# Patient Record
Sex: Female | Born: 1945 | Race: White | Hispanic: No | Smoking: Current every day smoker
Health system: Southern US, Community
[De-identification: ages and names within clinical notes are randomized; demographics above are authoritative.]

## PROBLEM LIST (undated history)

## (undated) DIAGNOSIS — F32A Depression, unspecified: Secondary | ICD-10-CM

## (undated) DIAGNOSIS — M810 Age-related osteoporosis without current pathological fracture: Secondary | ICD-10-CM

## (undated) DIAGNOSIS — F329 Major depressive disorder, single episode, unspecified: Secondary | ICD-10-CM

## (undated) DIAGNOSIS — J449 Chronic obstructive pulmonary disease, unspecified: Secondary | ICD-10-CM

## (undated) DIAGNOSIS — K219 Gastro-esophageal reflux disease without esophagitis: Secondary | ICD-10-CM

## (undated) DIAGNOSIS — E785 Hyperlipidemia, unspecified: Secondary | ICD-10-CM

## (undated) DIAGNOSIS — I10 Essential (primary) hypertension: Secondary | ICD-10-CM

## (undated) DIAGNOSIS — G4730 Sleep apnea, unspecified: Secondary | ICD-10-CM

## (undated) HISTORY — PX: KNEE ARTHROSCOPY: SHX127

## (undated) HISTORY — PX: ABDOMINAL HYSTERECTOMY: SHX81

## (undated) HISTORY — PX: BREAST SURGERY: SHX581

## (undated) HISTORY — PX: COLONOSCOPY: SHX174

## (undated) HISTORY — PX: TONSILLECTOMY: SUR1361

---

## 2011-10-14 ENCOUNTER — Other Ambulatory Visit: Payer: Self-pay | Admitting: Orthopedic Surgery

## 2011-10-24 ENCOUNTER — Encounter (HOSPITAL_BASED_OUTPATIENT_CLINIC_OR_DEPARTMENT_OTHER): Payer: Self-pay | Admitting: *Deleted

## 2011-10-24 NOTE — Progress Notes (Signed)
[place] lives in [place] comong in 2 hr preop for ekg-istat Will see if she has had any with pcp

## 2011-10-26 ENCOUNTER — Encounter (HOSPITAL_BASED_OUTPATIENT_CLINIC_OR_DEPARTMENT_OTHER): Payer: Self-pay | Admitting: Anesthesiology

## 2011-10-26 ENCOUNTER — Ambulatory Visit (HOSPITAL_BASED_OUTPATIENT_CLINIC_OR_DEPARTMENT_OTHER): Payer: BC Managed Care – PPO | Admitting: Anesthesiology

## 2011-10-26 ENCOUNTER — Ambulatory Visit (HOSPITAL_BASED_OUTPATIENT_CLINIC_OR_DEPARTMENT_OTHER)
Admission: RE | Admit: 2011-10-26 | Discharge: 2011-10-26 | Disposition: A | Discharge status: Home / Self Care | Payer: BC Managed Care – PPO | Source: Ambulatory Visit | Attending: Orthopedic Surgery | Admitting: Orthopedic Surgery

## 2011-10-26 ENCOUNTER — Encounter (HOSPITAL_BASED_OUTPATIENT_CLINIC_OR_DEPARTMENT_OTHER): Payer: Self-pay | Admitting: Orthopedic Surgery

## 2011-10-26 ENCOUNTER — Encounter (HOSPITAL_BASED_OUTPATIENT_CLINIC_OR_DEPARTMENT_OTHER)
Admission: RE | Disposition: A | Discharge status: Home / Self Care | Payer: Self-pay | Source: Ambulatory Visit | Attending: Orthopedic Surgery

## 2011-10-26 DIAGNOSIS — M9380 Other specified osteochondropathies of unspecified site: Secondary | ICD-10-CM | POA: Insufficient documentation

## 2011-10-26 DIAGNOSIS — G5600 Carpal tunnel syndrome, unspecified upper limb: Secondary | ICD-10-CM | POA: Insufficient documentation

## 2011-10-26 DIAGNOSIS — M810 Age-related osteoporosis without current pathological fracture: Secondary | ICD-10-CM | POA: Insufficient documentation

## 2011-10-26 DIAGNOSIS — J449 Chronic obstructive pulmonary disease, unspecified: Secondary | ICD-10-CM | POA: Insufficient documentation

## 2011-10-26 DIAGNOSIS — F17200 Nicotine dependence, unspecified, uncomplicated: Secondary | ICD-10-CM | POA: Insufficient documentation

## 2011-10-26 DIAGNOSIS — G4730 Sleep apnea, unspecified: Secondary | ICD-10-CM | POA: Insufficient documentation

## 2011-10-26 DIAGNOSIS — I10 Essential (primary) hypertension: Secondary | ICD-10-CM | POA: Insufficient documentation

## 2011-10-26 DIAGNOSIS — F3289 Other specified depressive episodes: Secondary | ICD-10-CM | POA: Insufficient documentation

## 2011-10-26 DIAGNOSIS — F329 Major depressive disorder, single episode, unspecified: Secondary | ICD-10-CM | POA: Insufficient documentation

## 2011-10-26 DIAGNOSIS — J4489 Other specified chronic obstructive pulmonary disease: Secondary | ICD-10-CM | POA: Insufficient documentation

## 2011-10-26 DIAGNOSIS — M65839 Other synovitis and tenosynovitis, unspecified forearm: Secondary | ICD-10-CM | POA: Insufficient documentation

## 2011-10-26 DIAGNOSIS — K219 Gastro-esophageal reflux disease without esophagitis: Secondary | ICD-10-CM | POA: Insufficient documentation

## 2011-10-26 DIAGNOSIS — E785 Hyperlipidemia, unspecified: Secondary | ICD-10-CM | POA: Insufficient documentation

## 2011-10-26 HISTORY — DX: Depression, unspecified: F32.A

## 2011-10-26 HISTORY — DX: Age-related osteoporosis without current pathological fracture: M81.0

## 2011-10-26 HISTORY — PX: CARPECTOMY: SHX5004

## 2011-10-26 HISTORY — PX: CARPAL TUNNEL RELEASE: SHX101

## 2011-10-26 HISTORY — DX: Gastro-esophageal reflux disease without esophagitis: K21.9

## 2011-10-26 HISTORY — DX: Hyperlipidemia, unspecified: E78.5

## 2011-10-26 HISTORY — DX: Essential (primary) hypertension: I10

## 2011-10-26 HISTORY — DX: Major depressive disorder, single episode, unspecified: F32.9

## 2011-10-26 HISTORY — DX: Chronic obstructive pulmonary disease, unspecified: J44.9

## 2011-10-26 HISTORY — DX: Sleep apnea, unspecified: G47.30

## 2011-10-26 LAB — POCT I-STAT, CHEM 8
Calcium, Ion: 1.26 mmol/L (ref 1.13–1.30)
Creatinine, Ser: 0.8 mg/dL (ref 0.50–1.10)
Glucose, Bld: 106 mg/dL — ABNORMAL HIGH (ref 70–99)
HCT: 42 % (ref 36.0–46.0)
Hemoglobin: 14.3 g/dL (ref 12.0–15.0)
Potassium: 5 mEq/L (ref 3.5–5.1)
TCO2: 26 mmol/L (ref 0–100)

## 2011-10-26 SURGERY — CARPECTOMY
Anesthesia: General | Site: Wrist | Laterality: Left | Wound class: Clean

## 2011-10-26 MED ORDER — HYDROMORPHONE HCL PF 1 MG/ML IJ SOLN: 0.2500 mg | INTRAMUSCULAR | Status: DC | PRN

## 2011-10-26 MED ORDER — METOCLOPRAMIDE HCL 5 MG/ML IJ SOLN: 10.0000 mg | Freq: Once | INTRAMUSCULAR | Status: DC | PRN

## 2011-10-26 MED ORDER — DEXTROSE 5 % IV SOLN
3.0000 g | INTRAVENOUS | Status: AC
  Administered 2011-10-26: 3 g via INTRAVENOUS

## 2011-10-26 MED ORDER — DEXAMETHASONE SODIUM PHOSPHATE 10 MG/ML IJ SOLN
INTRAMUSCULAR | Status: DC | PRN
  Administered 2011-10-26: 10 mg via INTRAVENOUS

## 2011-10-26 MED ORDER — OXYCODONE HCL 5 MG/5ML PO SOLN: 5.0000 mg | Freq: Once | ORAL | Status: DC | PRN

## 2011-10-26 MED ORDER — FENTANYL CITRATE 0.05 MG/ML IJ SOLN
50.0000 ug | INTRAMUSCULAR | Status: DC | PRN
  Administered 2011-10-26: 50 ug via INTRAVENOUS

## 2011-10-26 MED ORDER — LACTATED RINGERS IV SOLN
INTRAVENOUS | Status: DC
  Administered 2011-10-26 (×2): via INTRAVENOUS

## 2011-10-26 MED ORDER — EPHEDRINE SULFATE 50 MG/ML IJ SOLN
INTRAMUSCULAR | Status: DC | PRN
  Administered 2011-10-26 (×3): 10 mg via INTRAVENOUS

## 2011-10-26 MED ORDER — PHENYLEPHRINE HCL 10 MG/ML IJ SOLN
INTRAMUSCULAR | Status: DC | PRN
  Administered 2011-10-26 (×2): 80 ug via INTRAVENOUS

## 2011-10-26 MED ORDER — LIDOCAINE HCL (CARDIAC) 20 MG/ML IV SOLN
INTRAVENOUS | Status: DC | PRN
  Administered 2011-10-26: 40 mg via INTRAVENOUS

## 2011-10-26 MED ORDER — ROPIVACAINE HCL 5 MG/ML IJ SOLN
INTRAMUSCULAR | Status: DC | PRN
  Administered 2011-10-26: 25 mL

## 2011-10-26 MED ORDER — CHLORHEXIDINE GLUCONATE 4 % EX LIQD: 60.0000 mL | Freq: Once | CUTANEOUS | Status: DC

## 2011-10-26 MED ORDER — PROPOFOL 10 MG/ML IV EMUL
INTRAVENOUS | Status: DC | PRN
  Administered 2011-10-26: 200 mg via INTRAVENOUS

## 2011-10-26 MED ORDER — LIDOCAINE HCL 1 % IJ SOLN
INTRAMUSCULAR | Status: DC | PRN
  Administered 2011-10-26: 2 mL via INTRADERMAL

## 2011-10-26 MED ORDER — ONDANSETRON HCL 4 MG/2ML IJ SOLN
INTRAMUSCULAR | Status: DC | PRN
  Administered 2011-10-26: 4 mg via INTRAVENOUS

## 2011-10-26 MED ORDER — OXYCODONE HCL 5 MG PO TABS: 5.0000 mg | ORAL_TABLET | Freq: Once | ORAL | Status: DC | PRN

## 2011-10-26 MED ORDER — OXYCODONE-ACETAMINOPHEN 7.5-500 MG PO TABS: 1.0000 | ORAL_TABLET | ORAL | Status: AC | PRN

## 2011-10-26 MED ORDER — MIDAZOLAM HCL 2 MG/2ML IJ SOLN
1.0000 mg | INTRAMUSCULAR | Status: DC | PRN
  Administered 2011-10-26: 2 mg via INTRAVENOUS

## 2011-10-26 SURGICAL SUPPLY — 58 items
BANDAGE GAUZE ELAST BULKY 4 IN (GAUZE/BANDAGES/DRESSINGS) ×2 IMPLANT
BLADE ARTHRO LOK 4 BEAVER (BLADE) ×2 IMPLANT
BLADE EAR TYMPAN 2.5 60D BEAV (BLADE) IMPLANT
BLADE MINI RND TIP GREEN BEAV (BLADE) ×2 IMPLANT
BLADE SURG 15 STRL LF DISP TIS (BLADE) ×1 IMPLANT
BLADE SURG 15 STRL SS (BLADE) ×1
BNDG COHESIVE 3X5 TAN STRL LF (GAUZE/BANDAGES/DRESSINGS) ×2 IMPLANT
BNDG ESMARK 4X9 LF (GAUZE/BANDAGES/DRESSINGS) ×2 IMPLANT
CHLORAPREP W/TINT 26ML (MISCELLANEOUS) ×2 IMPLANT
CLOTH BEACON ORANGE TIMEOUT ST (SAFETY) ×2 IMPLANT
CORDS BIPOLAR (ELECTRODE) ×4 IMPLANT
COVER MAYO STAND STRL (DRAPES) ×2 IMPLANT
COVER TABLE BACK 60X90 (DRAPES) ×2 IMPLANT
CUFF TOURNIQUET SINGLE 18IN (TOURNIQUET CUFF) IMPLANT
CUFF TOURNIQUET SINGLE 24IN (TOURNIQUET CUFF) ×2 IMPLANT
DECANTER SPIKE VIAL GLASS SM (MISCELLANEOUS) IMPLANT
DRAPE EXTREMITY T 121X128X90 (DRAPE) ×2 IMPLANT
DRAPE OEC MINIVIEW 54X84 (DRAPES) ×2 IMPLANT
DRAPE SURG 17X23 STRL (DRAPES) ×2 IMPLANT
DRSG KUZMA FLUFF (GAUZE/BANDAGES/DRESSINGS) ×2 IMPLANT
GAUZE XEROFORM 1X8 LF (GAUZE/BANDAGES/DRESSINGS) ×2 IMPLANT
GLOVE BIO SURGEON STRL SZ 6.5 (GLOVE) ×2 IMPLANT
GLOVE BIOGEL PI IND STRL 6.5 (GLOVE) ×1 IMPLANT
GLOVE BIOGEL PI IND STRL 8.5 (GLOVE) ×1 IMPLANT
GLOVE BIOGEL PI INDICATOR 6.5 (GLOVE) ×1
GLOVE BIOGEL PI INDICATOR 8.5 (GLOVE) ×1
GLOVE ECLIPSE 6.5 STRL STRAW (GLOVE) ×6 IMPLANT
GLOVE SURG ORTHO 8.0 STRL STRW (GLOVE) ×2 IMPLANT
GLOVE SURG SS PI 8.5 STRL IVOR (GLOVE) ×1
GLOVE SURG SS PI 8.5 STRL STRW (GLOVE) ×1 IMPLANT
GOWN BRE IMP PREV XXLGXLNG (GOWN DISPOSABLE) ×2 IMPLANT
GOWN PREVENTION PLUS XLARGE (GOWN DISPOSABLE) ×6 IMPLANT
NEEDLE 27GAX1X1/2 (NEEDLE) ×2 IMPLANT
NS IRRIG 1000ML POUR BTL (IV SOLUTION) ×2 IMPLANT
PACK BASIN DAY SURGERY FS (CUSTOM PROCEDURE TRAY) ×2 IMPLANT
PAD CAST 3X4 CTTN HI CHSV (CAST SUPPLIES) ×1 IMPLANT
PADDING CAST ABS 3INX4YD NS (CAST SUPPLIES) ×1
PADDING CAST ABS 4INX4YD NS (CAST SUPPLIES) ×1
PADDING CAST ABS COTTON 3X4 (CAST SUPPLIES) ×1 IMPLANT
PADDING CAST ABS COTTON 4X4 ST (CAST SUPPLIES) ×1 IMPLANT
PADDING CAST COTTON 3X4 STRL (CAST SUPPLIES) ×1
SLEEVE SCD COMPRESS KNEE MED (MISCELLANEOUS) ×2 IMPLANT
SPLINT PLASTER CAST XFAST 3X15 (CAST SUPPLIES) ×11 IMPLANT
SPLINT PLASTER XTRA FASTSET 3X (CAST SUPPLIES) ×11
SPONGE GAUZE 4X4 12PLY (GAUZE/BANDAGES/DRESSINGS) ×2 IMPLANT
STOCKINETTE 4X48 STRL (DRAPES) ×2 IMPLANT
SUT MERSILENE 3 0 FS 1 (SUTURE) ×2 IMPLANT
SUT VIC AB 0 CT1 27 (SUTURE)
SUT VIC AB 0 CT1 27XBRD ANBCTR (SUTURE) IMPLANT
SUT VIC AB 2-0 SH 27 (SUTURE)
SUT VIC AB 2-0 SH 27XBRD (SUTURE) IMPLANT
SUT VICRYL 4-0 PS2 18IN ABS (SUTURE) ×2 IMPLANT
SUT VICRYL RAPIDE 4/0 PS 2 (SUTURE) ×2 IMPLANT
SYR BULB 3OZ (MISCELLANEOUS) ×2 IMPLANT
SYR CONTROL 10ML LL (SYRINGE) IMPLANT
TOWEL OR 17X24 6PK STRL BLUE (TOWEL DISPOSABLE) ×2 IMPLANT
UNDERPAD 30X30 INCONTINENT (UNDERPADS AND DIAPERS) ×2 IMPLANT
WATER STERILE IRR 1000ML POUR (IV SOLUTION) ×2 IMPLANT

## 2011-10-26 NOTE — Transfer of Care (Signed)
Immediate Anesthesia Transfer of Care Note  Patient: Rebecca [name]  Procedure(s) Performed: Procedure(s) (LRB): CARPECTOMY (Left) CARPAL TUNNEL RELEASE (Left)  Patient Location: PACU  Anesthesia Type: GA combined with regional for post-op pain  Level of Consciousness: sedated  Airway & Oxygen Therapy: Patient Spontanous Breathing and Patient connected to face mask oxygen  Post-op Assessment: Report given to PACU RN and Post -op Vital signs reviewed and stable  Post vital signs: Reviewed and stable  Complications: No apparent anesthesia complications

## 2011-10-26 NOTE — Op Note (Signed)
Dictated number: [id number]

## 2011-10-26 NOTE — Progress Notes (Signed)
Assisted Dr. Frederick with left, ultrasound guided, supraclavicular block. Side rails up, monitors on throughout procedure. See vital signs in flow sheet. Tolerated Procedure well. 
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
## 2011-10-26 NOTE — Anesthesia Procedure Notes (Addendum)
Anesthesia Regional Block:  Supraclavicular block  Pre-Anesthetic Checklist: ,, timeout performed, Correct Patient, Correct Site, Correct Laterality, Correct Procedure, Correct Position, site marked, Risks and benefits discussed,  Surgical consent,  Pre-op evaluation,  At surgeon's request and post-op pain management  Laterality: Left  Prep: chloraprep       Needles:   Needle Type: Other   (Arrow Echogenic)   Needle Length: 9cm  Needle Gauge: 21    Additional Needles:  Procedures: ultrasound guided Supraclavicular block Narrative:  Start time: 10/26/2011 10:36 AM End time: 10/26/2011 10:42 AM Injection made incrementally with aspirations every 5 mL.  Performed by: Personally  Anesthesiologist: C Frederick  Additional Notes: Ultrasound guidance used to: id relevant anatomy, confirm needle position, local anesthetic spread, avoidance of vascular puncture. Picture saved. No complications. Block performed personally by Janetta Hora. Gelene Mink, MD    Supraclavicular block Procedure Name: LMA Insertion Date/Time: 10/26/2011 11:26 AM Performed by: Burna Cash Pre-anesthesia Checklist: Patient identified, Emergency Drugs available, Suction available and Patient being monitored Patient Re-evaluated:Patient Re-evaluated prior to inductionOxygen Delivery Method: Circle System Utilized Preoxygenation: Pre-oxygenation with 100% oxygen Intubation Type: IV induction Ventilation: Mask ventilation without difficulty LMA: LMA with gastric port inserted LMA Size: 4.0 Number of attempts: 1 Placement Confirmation: positive ETCO2 Tube secured with: Tape Dental Injury: Teeth and Oropharynx as per pre-operative assessment

## 2011-10-26 NOTE — Brief Op Note (Signed)
10/26/2011  12:51 PM  PATIENT:  Rebecca [name]  66 y.o. 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   PROCEDURE:  Procedure(s) (LRB): CARPECTOMY (Left) CARPAL TUNNEL RELEASE (Left)  SURGEON:  Surgeon(s) and Role:    * Nicki Reaper, MD - Primary  PHYSICIAN ASSISTANT:   ASSISTANTS: none   ANESTHESIA:   regional and general  EBL:  Total I/O In: 400 [I.V.:400] Out: -   BLOOD ADMINISTERED:none  DRAINS: none   LOCAL MEDICATIONS USED:  NONE  SPECIMEN:  Excision  DISPOSITION OF SPECIMEN:  PATHOLOGY  COUNTS:  YES  TOURNIQUET:   Total Tourniquet Time Documented: area (laterality) - 65 minutes  DICTATION: .Other Dictation: Dictation Number [id number]  PLAN OF CARE: Discharge to home after PACU  PATIENT DISPOSITION:  PACU - hemodynamically stable.

## 2011-10-26 NOTE — Anesthesia Preprocedure Evaluation (Signed)
Anesthesia Evaluation  Patient identified by MRN, date of birth, ID band Patient awake    Reviewed: Allergy & Precautions, H&P , NPO status , Patient's Chart, lab work & pertinent test results, reviewed documented beta blocker date and time   Airway Mallampati: II TM Distance: >3 FB Neck ROM: full    Dental   Pulmonary COPD breath sounds clear to auscultation        Cardiovascular hypertension, Pt. on medications and Pt. on home beta blockers negative cardio ROS  Rhythm:regular     Neuro/Psych PSYCHIATRIC DISORDERS Depression negative neurological ROS     GI/Hepatic Neg liver ROS, GERD-  Medicated and Controlled,  Endo/Other  Morbid obesity  Renal/GU negative Renal ROS  negative genitourinary   Musculoskeletal   Abdominal   Peds  Hematology negative hematology ROS (+)   Anesthesia Other Findings See surgeon's H&P   Suggestion of OSA- no w/u  Reproductive/Obstetrics negative OB ROS                           Anesthesia Physical Anesthesia Plan  ASA: III  Anesthesia Plan: General   Post-op Pain Management:    Induction: Intravenous  Airway Management Planned: LMA  Additional Equipment:   Intra-op Plan:   Post-operative Plan: Extubation in OR  Informed Consent: I have reviewed the patients History and Physical, chart, labs and discussed the procedure including the risks, benefits and alternatives for the proposed anesthesia with the patient or authorized representative who has indicated his/her understanding and acceptance.   Dental Advisory Given  Plan Discussed with: CRNA and Surgeon  Anesthesia Plan Comments:         Anesthesia Quick Evaluation

## 2011-10-26 NOTE — Anesthesia Postprocedure Evaluation (Signed)
Anesthesia Post Note  Patient: Rebecca [name]  Procedure(s) Performed: Procedure(s) (LRB): CARPECTOMY (Left) CARPAL TUNNEL RELEASE (Left)  Anesthesia type: General  Patient location: PACU  Post pain: Pain level controlled  Post assessment: Patient's Cardiovascular Status Stable  Last Vitals:  Filed Vitals:   10/26/11 1345  BP: 103/47  Pulse: 64  Temp:   Resp: 11    Post vital signs: Reviewed and stable  Level of consciousness: alert  Complications: No apparent anesthesia complications

## 2011-10-27 NOTE — Op Note (Signed)
Rebecca [name], RIDINGER NO.:  [id number]  MEDICAL RECORD NO.:  [id number]  LOCATION:                                 FACILITY:  PHYSICIAN:  Cindee Salt, M.D.            DATE OF BIRTH:  DATE OF PROCEDURE:  10/26/2011 DATE OF DISCHARGE:                              OPERATIVE REPORT   PREOPERATIVE DIAGNOSES:  Kienbock disease, stage III, left wrist. Carpal tunnel syndrome, left hand.  Stenosing tenosynovitis, left middle finger.  POSTOPERATIVE DIAGNOSES:  Kienbock disease, stage III, left wrist. Carpal tunnel syndrome, left hand.  Stenosing tenosynovitis, left middle finger.  OPERATION:  Carpal tunnel release, left hand.  Release of stenosing tenosynovitis, left middle finger.  Proximal row carpectomy, left wrist with radial styloidectomy.  SURGEON:  Cindee Salt, MD  ASSISTANT:  None.  ANESTHESIA:  Supraclavicular block, general.  ANESTHESIOLOGIST:  Janetta Hora. Gelene Mink, MD  HISTORY:  The patient is a 66 year old female with a history of wrist pain and swelling.  X-rays, MRI, CT scan reveals stage IIIB Kienbock. Nerve conductions reveal carpal tunnel syndrome.  When she is seen in the preoperative area, she is complaining of catching of her left middle finger with triggering present.  She is desirous of proceeding to have each one of these treated.  She is scheduled for carpal tunnel release, release of stenosing tenosynovitis, proximal row carpectomy with possible radial styloidectomy.  Pre, peri and postoperative course have been discussed along with risks and complications.  She is aware that there is no guarantee with the surgery; possibility of infection; recurrence of injury to arteries, nerves, tendons; incomplete relief of symptoms and dystrophy.  In the preoperative area, the patient is seen, the extremity marked by both the patient and surgeon, and antibiotic given.  PROCEDURE:  The patient was brought to the operating room where a general  anesthetic was carried out without difficulty after a supraclavicular block was given in the preoperative area.  She was prepped using ChloraPrep, supine position, left arm free.  A 3-minute dry time was allowed.  Time-out taken, confirming the patient and procedure.  The limb was exsanguinated with an Esmarch bandage. Tourniquet placed high on the arm was inflated to 250 mmHg.  A straight incision was made in the palm for release of the carpal canal.  With blunt and sharp dissection, the palmar fascia was split.  Superficial palmar arch identified.  The flexor tendon of the ring and little finger was identified to the ulnar side of the median nerve.  Carpal retinaculum was incised with sharp dissection.  Right angle and Sewall retractor were placed between the skin and forearm fascia.  The fascia released for approximately 1.5-2 cm proximal to the wrist crease under direct vision.  Canal was explored.  Air compression to the nerve was apparent.  No further lesions were identified.  The wound was irrigated and closed with interrupted 4-0 Vicryl Rapide sutures.  Hemostasis was done with bipolar throughout the procedure.  A separate incision was then made over the A1 pulley of the left middle finger carried down through the subcutaneous tissue.  Neurovascular structures were protected with  retractors.  The A1 pulley was identified.  This was then released on its radial aspect.  A small incision was made centrally in the A2 pulley.  The finger was placed through a full range motion. Partial tenosynovectomy was performed proximally, no further triggering was noted.  No further lesions were seen.  The wound was irrigated and the skin was closed with interrupted 4-0 Vicryl Rapide sutures.  A separate incision was then made over the dorsum of the wrist, longitudinally in nature, carried down again through the subcutaneous tissue.  Bleeders were again electrocauterized with bipolar.   Dissection carried down to the extensor retinaculum, this was incised longitudinally on the fourth dorsal compartment.  The EPL was identified and protected.  The dissection was then carried through the dorsal capsule longitudinally directly over the capitate leaving a large captive tissue proximally.  A small T was made in the capsule distal to its insertion on the radius.  The capitate showed no significant change of the proximal pole.  The lunate was noted to be fractured.  The scapholunate ligament was then incised along with lunotriquetral ligament, this allowed isolation of the scaphoid with blunt and sharp dissection, and this was dissected free and removed piecemeal due to the fact that it was extremely soft.  A radial styloidectomy was then performed, in that, this was quite long, arthritic changes were present. The triquetrum was then removed with blunt and sharp dissection.  The lunate was fractured, had collapsed, again was extremely soft, this was removed.  Care was taken to protect the radioscaphocapitate ligament, the volar ligaments to the capsule.  The wound was copiously irrigated with saline.  No impingement of the styloid was present on the trapezium after its removal.  X-rays were then taken in AP and lateral direction revealing that the capitate had proximally translocated into the lunate fossa.  The capsule was then closed with figure-of-eight 3-0 Mersilene sutures.  The extensor retinaculum was repaired over the tendons with 3- 0 Mersilene sutures.  The subcutaneous tissue with interrupted 4-0 Vicryl and the skin with interrupted 4-0 Vicryl Rapide.  A sterile compressive dressing was applied.  X-rays again confirmed that there was no change in position to the capitate and lunate fossa.  A dorsal palmar radial thumb spica splint was applied and x-rays again taken revealing that the capitate remained and the lunate fossa both AP and lateral directions.  The patient  tolerated the procedure well.  On deflation of the tourniquet, all fingers were immediately pinked.  She was taken to the recovery room for observation.          ______________________________ Cindee Salt, M.D.     GK/MEDQ  D:  10/26/2011  T:  10/27/2011  Job:  [id number]

## 2011-11-01 ENCOUNTER — Encounter (HOSPITAL_BASED_OUTPATIENT_CLINIC_OR_DEPARTMENT_OTHER): Payer: Self-pay | Admitting: Orthopedic Surgery

## 2016-01-18 ENCOUNTER — Other Ambulatory Visit (HOSPITAL_COMMUNITY): Payer: Self-pay | Admitting: Podiatry

## 2016-01-18 DIAGNOSIS — I739 Peripheral vascular disease, unspecified: Secondary | ICD-10-CM

## 2016-01-22 ENCOUNTER — Ambulatory Visit (HOSPITAL_COMMUNITY)
Admission: RE | Admit: 2016-01-22 | Discharge: 2016-01-22 | Disposition: A | Discharge status: Home / Self Care | Payer: Medicare HMO | Source: Ambulatory Visit | Attending: Podiatry | Admitting: Podiatry

## 2016-01-22 DIAGNOSIS — I739 Peripheral vascular disease, unspecified: Secondary | ICD-10-CM | POA: Diagnosis not present

## 2018-04-04 NOTE — Patient Instructions (Signed)
Your procedure is scheduled on: 04/13/2018  Report to [facility] at   645  AM.  Call this number if you have problems the morning of surgery: [phone]   Do not eat food or drink liquids :After Midnight.      Take these medicines the morning of surgery with A SIP OF WATER: wellbutrin, allegra, losartan, metoprolol, prilosec.   Do not wear jewelry, make-up or nail polish.  Do not wear lotions, powders, or perfumes. You may wear deodorant.  Do not shave 48 hours prior to surgery.  Do not bring valuables to the hospital.  Contacts, dentures or bridgework may not be worn into surgery.  Leave suitcase in the car. After surgery it may be brought to your room.  For patients admitted to the hospital, checkout time is 11:00 AM the day of discharge.   Patients discharged the day of surgery will not be allowed to drive home.  :     Please read over the following fact sheets that you were given: Coughing and Deep Breathing, Surgical Site Infection Prevention, Anesthesia Post-op Instructions and Care and Recovery After Surgery    Cataract A cataract is a clouding of the lens of the eye. When a lens becomes cloudy, vision is reduced based on the degree and nature of the clouding. Many cataracts reduce vision to some degree. Some cataracts make people more near-sighted as they develop. Other cataracts increase glare. Cataracts that are ignored and become worse can sometimes look white. The white color can be seen through the pupil. CAUSES   Aging. However, cataracts may occur at any age, even in newborns.   Certain drugs.   Trauma to the eye.   Certain diseases such as diabetes.   Specific eye diseases such as chronic inflammation inside the eye or a sudden attack of a rare form of glaucoma.   Inherited or acquired medical problems.  SYMPTOMS   Gradual, progressive drop in vision in the affected eye.   Severe, rapid visual loss. This most often happens when trauma is the cause.  DIAGNOSIS    To detect a cataract, an eye doctor examines the lens. Cataracts are best diagnosed with an exam of the eyes with the pupils enlarged (dilated) by drops.  TREATMENT  For an early cataract, vision may improve by using different eyeglasses or stronger lighting. If that does not help your vision, surgery is the only effective treatment. A cataract needs to be surgically removed when vision loss interferes with your everyday activities, such as driving, reading, or watching TV. A cataract may also have to be removed if it prevents examination or treatment of another eye problem. Surgery removes the cloudy lens and usually replaces it with a substitute lens (intraocular lens, IOL).  At a time when both you and your doctor agree, the cataract will be surgically removed. If you have cataracts in both eyes, only one is usually removed at a time. This allows the operated eye to heal and be out of danger from any possible problems after surgery (such as infection or poor wound healing). In rare cases, a cataract may be doing damage to your eye. In these cases, your caregiver may advise surgical removal right away. The vast majority of people who have cataract surgery have better vision afterward. HOME CARE INSTRUCTIONS  If you are not planning surgery, you may be asked to do the following:  Use different eyeglasses.   Use stronger or brighter lighting.   Ask your eye doctor  about reducing your medicine dose or changing medicines if it is thought that a medicine caused your cataract. Changing medicines does not make the cataract go away on its own.   Become familiar with your surroundings. Poor vision can lead to injury. Avoid bumping into things on the affected side. You are at a higher risk for tripping or falling.   Exercise extreme care when driving or operating machinery.   Wear sunglasses if you are sensitive to bright light or experiencing problems with glare.  SEEK IMMEDIATE MEDICAL CARE IF:   You  have a worsening or sudden vision loss.   You notice redness, swelling, or increasing pain in the eye.   You have a fever.  Document Released: 02/21/2005 Document Revised: 02/10/2011 Document Reviewed: 10/15/2010 [facility] Patient Information 2012 [place].PATIENT INSTRUCTIONS POST-ANESTHESIA  IMMEDIATELY FOLLOWING SURGERY:  Do not drive or operate machinery for the first twenty four hours after surgery.  Do not make any important decisions for twenty four hours after surgery or while taking narcotic pain medications or sedatives.  If you develop intractable nausea and vomiting or a severe headache please notify your doctor immediately.  FOLLOW-UP:  Please make an appointment with your surgeon as instructed. You do not need to follow up with anesthesia unless specifically instructed to do so.  WOUND CARE INSTRUCTIONS (if applicable):  Keep a dry clean dressing on the anesthesia/puncture wound site if there is drainage.  Once the wound has quit draining you may leave it open to air.  Generally you should leave the bandage intact for twenty four hours unless there is drainage.  If the epidural site drains for more than 36-48 hours please call the anesthesia department.  QUESTIONS?:  Please feel free to call your physician or the hospital operator if you have any questions, and they will be happy to assist you.

## 2018-04-06 ENCOUNTER — Encounter (HOSPITAL_COMMUNITY): Payer: Self-pay

## 2018-04-06 ENCOUNTER — Encounter (HOSPITAL_COMMUNITY)
Admission: RE | Admit: 2018-04-06 | Discharge: 2018-04-06 | Disposition: A | Discharge status: Home / Self Care | Payer: 59 | Source: Ambulatory Visit | Attending: Ophthalmology | Admitting: Ophthalmology

## 2018-04-06 ENCOUNTER — Other Ambulatory Visit: Payer: Self-pay

## 2018-04-06 DIAGNOSIS — Z01818 Encounter for other preprocedural examination: Secondary | ICD-10-CM | POA: Insufficient documentation

## 2018-04-06 LAB — CBC
HCT: 38.3 % (ref 36.0–46.0)
Hemoglobin: 12.2 g/dL (ref 12.0–15.0)
MCH: 28.1 pg (ref 26.0–34.0)
MCHC: 31.9 g/dL (ref 30.0–36.0)
MCV: 88.2 fL (ref 80.0–100.0)
Platelets: 333 10*3/uL (ref 150–400)
RBC: 4.34 MIL/uL (ref 3.87–5.11)
RDW: 15.3 % (ref 11.5–15.5)
WBC: 10.2 10*3/uL (ref 4.0–10.5)
nRBC: 0 % (ref 0.0–0.2)

## 2018-04-06 LAB — BASIC METABOLIC PANEL
Anion gap: 9 (ref 5–15)
BUN: 20 mg/dL (ref 8–23)
CALCIUM: 9 mg/dL (ref 8.9–10.3)
CO2: 27 mmol/L (ref 22–32)
Chloride: 101 mmol/L (ref 98–111)
Creatinine, Ser: 0.85 mg/dL (ref 0.44–1.00)
GFR calc Af Amer: >60 mL/min (ref >60)
GLUCOSE: 168 mg/dL — AB (ref 70–99)
Potassium: 3.8 mmol/L (ref 3.5–5.1)
Sodium: 137 mmol/L (ref 135–145)

## 2018-04-06 LAB — GLUCOSE, CAPILLARY: Glucose-Capillary: 162 mg/dL — ABNORMAL HIGH (ref 70–99)

## 2018-04-06 LAB — HEMOGLOBIN A1C
Hgb A1c MFr Bld: 6.6 % — ABNORMAL HIGH (ref 4.8–5.6)
Mean Plasma Glucose: 142.72 mg/dL

## 2018-04-13 ENCOUNTER — Encounter (HOSPITAL_COMMUNITY)
Admission: RE | Disposition: A | Discharge status: Home / Self Care | Payer: Self-pay | Source: Home / Self Care | Attending: Ophthalmology

## 2018-04-13 ENCOUNTER — Ambulatory Visit (HOSPITAL_COMMUNITY)
Admission: RE | Admit: 2018-04-13 | Discharge: 2018-04-13 | Disposition: A | Discharge status: Home / Self Care | Payer: 59 | Attending: Ophthalmology | Admitting: Ophthalmology

## 2018-04-13 ENCOUNTER — Ambulatory Visit (HOSPITAL_COMMUNITY): Payer: 59 | Admitting: Anesthesiology

## 2018-04-13 ENCOUNTER — Encounter (HOSPITAL_COMMUNITY): Payer: Self-pay

## 2018-04-13 DIAGNOSIS — E1136 Type 2 diabetes mellitus with diabetic cataract: Secondary | ICD-10-CM | POA: Insufficient documentation

## 2018-04-13 DIAGNOSIS — J449 Chronic obstructive pulmonary disease, unspecified: Secondary | ICD-10-CM | POA: Insufficient documentation

## 2018-04-13 DIAGNOSIS — Z7984 Long term (current) use of oral hypoglycemic drugs: Secondary | ICD-10-CM | POA: Diagnosis not present

## 2018-04-13 DIAGNOSIS — M069 Rheumatoid arthritis, unspecified: Secondary | ICD-10-CM | POA: Insufficient documentation

## 2018-04-13 DIAGNOSIS — Z6838 Body mass index (BMI) 38.0-38.9, adult: Secondary | ICD-10-CM | POA: Insufficient documentation

## 2018-04-13 DIAGNOSIS — G4730 Sleep apnea, unspecified: Secondary | ICD-10-CM | POA: Insufficient documentation

## 2018-04-13 DIAGNOSIS — H259 Unspecified age-related cataract: Secondary | ICD-10-CM | POA: Insufficient documentation

## 2018-04-13 DIAGNOSIS — I10 Essential (primary) hypertension: Secondary | ICD-10-CM | POA: Insufficient documentation

## 2018-04-13 HISTORY — PX: CATARACT EXTRACTION W/PHACO: SHX586

## 2018-04-13 LAB — GLUCOSE, CAPILLARY: GLUCOSE-CAPILLARY: 138 mg/dL — AB (ref 70–99)

## 2018-04-13 SURGERY — PHACOEMULSIFICATION, CATARACT, WITH IOL INSERTION
Anesthesia: Monitor Anesthesia Care | Site: Eye | Laterality: Left

## 2018-04-13 MED ORDER — EPINEPHRINE PF 1 MG/ML IJ SOLN
INTRAOCULAR | Status: DC | PRN
  Administered 2018-04-13: 500 mL

## 2018-04-13 MED ORDER — TETRACAINE HCL 0.5 % OP SOLN
1.0000 [drp] | OPHTHALMIC | Status: AC
  Administered 2018-04-13 (×3): 1 [drp] via OPHTHALMIC

## 2018-04-13 MED ORDER — BSS IO SOLN
INTRAOCULAR | Status: DC | PRN
  Administered 2018-04-13: 15 mL

## 2018-04-13 MED ORDER — MIDAZOLAM HCL 2 MG/2ML IJ SOLN
INTRAMUSCULAR | Status: AC
  Filled 2018-04-13: qty 2

## 2018-04-13 MED ORDER — PHENYLEPHRINE HCL 2.5 % OP SOLN
1.0000 [drp] | OPHTHALMIC | Status: AC
  Administered 2018-04-13 (×3): 1 [drp] via OPHTHALMIC

## 2018-04-13 MED ORDER — NEOMYCIN-POLYMYXIN-DEXAMETH 3.5-10000-0.1 OP SUSP
OPHTHALMIC | Status: DC | PRN
  Administered 2018-04-13: 2 [drp] via OPHTHALMIC

## 2018-04-13 MED ORDER — LACTATED RINGERS IV SOLN: INTRAVENOUS | Status: DC

## 2018-04-13 MED ORDER — POVIDONE-IODINE 5 % OP SOLN
OPHTHALMIC | Status: DC | PRN
  Administered 2018-04-13: 1 via OPHTHALMIC

## 2018-04-13 MED ORDER — CYCLOPENTOLATE-PHENYLEPHRINE 0.2-1 % OP SOLN
1.0000 [drp] | OPHTHALMIC | Status: AC
  Administered 2018-04-13 (×3): 1 [drp] via OPHTHALMIC

## 2018-04-13 MED ORDER — MIDAZOLAM HCL 5 MG/5ML IJ SOLN
INTRAMUSCULAR | Status: DC | PRN
  Administered 2018-04-13 (×2): 1 mg via INTRAVENOUS

## 2018-04-13 MED ORDER — LIDOCAINE HCL (PF) 1 % IJ SOLN
INTRAOCULAR | Status: DC | PRN
  Administered 2018-04-13: 1 mL via OPHTHALMIC

## 2018-04-13 MED ORDER — PROVISC 10 MG/ML IO SOLN
INTRAOCULAR | Status: DC | PRN
  Administered 2018-04-13: 0.85 mL via INTRAOCULAR

## 2018-04-13 MED ORDER — SODIUM HYALURONATE 23 MG/ML IO SOLN
INTRAOCULAR | Status: DC | PRN
  Administered 2018-04-13: 0.6 mL via INTRAOCULAR

## 2018-04-13 MED ORDER — LIDOCAINE HCL 3.5 % OP GEL
1.0000 "application " | Freq: Once | OPHTHALMIC | Status: AC
  Administered 2018-04-13: 1 via OPHTHALMIC

## 2018-04-13 SURGICAL SUPPLY — 13 items
CLOTH BEACON ORANGE TIMEOUT ST (SAFETY) ×1 IMPLANT
EYE SHIELD UNIVERSAL CLEAR (GAUZE/BANDAGES/DRESSINGS) ×1 IMPLANT
GLOVE BIOGEL PI IND STRL 7.0 (GLOVE) IMPLANT
GLOVE BIOGEL PI INDICATOR 7.0 (GLOVE) ×1
LENS ALC ACRYL/TECN (Ophthalmic Related) ×1 IMPLANT
NDL HYPO 18GX1.5 BLUNT FILL (NEEDLE) IMPLANT
NEEDLE HYPO 18GX1.5 BLUNT FILL (NEEDLE) ×2 IMPLANT
PAD ARMBOARD 7.5X6 YLW CONV (MISCELLANEOUS) ×1 IMPLANT
SYR TB 1ML LL NO SAFETY (SYRINGE) ×1 IMPLANT
TAPE SURG TRANSPORE 1 IN (GAUZE/BANDAGES/DRESSINGS) IMPLANT
TAPE SURGICAL TRANSPORE 1 IN (GAUZE/BANDAGES/DRESSINGS) ×1
VISCOELASTIC ADDITIONAL (OPHTHALMIC RELATED) ×1 IMPLANT
WATER STERILE IRR 250ML POUR (IV SOLUTION) ×1 IMPLANT

## 2018-04-13 NOTE — Op Note (Signed)
Date of procedure: 04/13/18  Pre-operative diagnosis: Visually significant age-related cataract, Left Eye (H25.812)  Post-operative diagnosis: Visually significant age-related cataract, Left Eye  Procedure: Removal of cataract via phacoemulsification and insertion of intra-ocular lens [employer] Vision PCB00  +21.0D into the capsular bag of the Left Eye  Attending surgeon: Gerda Diss. Mutasim Tuckey, MD, MA  Anesthesia: MAC, Topical Akten  Complications: None  Estimated Blood Loss: <8m (minimal)  Specimens: None  Implants: As above  Indications:  Visually significant age-related cataract, Left Eye  Procedure:  The patient was seen and identified in the pre-operative area. The operative eye was identified and dilated.  The operative eye was marked.  Topical anesthesia was administered to the operative eye.     The patient was then to the operative suite and placed in the supine position.  A timeout was performed confirming the patient, procedure to be performed, and all other relevant information.   The patient's face was prepped and draped in the usual fashion for intra-ocular surgery.  A lid speculum was placed into the operative eye and the surgical microscope moved into place and focused.  An inferotemporal paracentesis was created using a 20 gauge paracentesis blade.  Shugarcaine was injected into the anterior chamber.  Viscoelastic was injected into the anterior chamber.  A temporal clear-corneal main wound incision was created using a 2.421mmicrokeratome.  A continuous curvilinear capsulorrhexis was initiated using an irrigating cystitome and completed using capsulorrhexis forceps.  Hydrodissection and hydrodeliniation were performed.  Viscoelastic was injected into the anterior chamber.  A phacoemulsification handpiece and a chopper as a second instrument were used to remove the nucleus and epinucleus. The irrigation/aspiration handpiece was used to remove any remaining cortical  material.   The capsular bag was reinflated with viscoelastic, checked, and found to be intact.  The intraocular lens was inserted into the capsular bag and dialed into place using a Kuglen hook.  The irrigation/aspiration handpiece was used to remove any remaining viscoelastic.  The clear corneal wound and paracentesis wounds were then hydrated and checked with Weck-Cels to be watertight.  The lid-speculum and drape was removed, and the patient's face was cleaned with a wet and dry 4x4.  Maxitrol was instilled in the eye before a clear shield was taped over the eye. The patient was taken to the post-operative care unit in good condition, having tolerated the procedure well.  Post-Op Instructions: [facility] and will receive all other orders and instructions.

## 2018-04-13 NOTE — H&P (Signed)
The H and P was reviewed and updated. The patient was examined.  No changes were found after exam.  The surgical eye was marked.  

## 2018-04-13 NOTE — Anesthesia Preprocedure Evaluation (Signed)
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 DM II not reported on H&P  Metformin and another Morbid obesity  Reproductive/Obstetrics                             Anesthesia Physical Anesthesia Plan  ASA: IV  Anesthesia Plan: MAC   Post-op Pain Management:    Induction:   PONV Risk Score and Plan:   Airway Management Planned:   Additional Equipment:   Intra-op Plan:   Post-operative Plan:   Informed Consent: I have reviewed the patients History and Physical, chart, labs and discussed the procedure including the risks, benefits and alternatives for the proposed anesthesia with the patient or authorized representative who has indicated his/her understanding and acceptance.       Plan Discussed with: Anesthesiologist  Anesthesia Plan Comments:         Anesthesia Quick Evaluation

## 2018-04-13 NOTE — Transfer of Care (Signed)
Immediate Anesthesia Transfer of Care Note  Patient: Rebecca [name]  Procedure(s) Performed: CATARACT EXTRACTION PHACO AND INTRAOCULAR LENS PLACEMENT (I[facility]) (Left Eye)  Patient Location: Short Stay  Anesthesia Type:MAC  Level of Consciousness: awake, alert , oriented and patient cooperative  Airway & Oxygen Therapy: Patient Spontanous Breathing  Post-op Assessment: Report given to RN and Post -op Vital signs reviewed and stable  Post vital signs: Reviewed and stable  Last Vitals:  Vitals Value Taken Time  BP    Temp    Pulse    Resp    SpO2      Last Pain:  Vitals:   04/13/18 0711  TempSrc: Oral  PainSc: 0-No pain      Patients Stated Pain Goal: 5 (27/07/86 7544)  Complications: No apparent anesthesia complications

## 2018-04-13 NOTE — Anesthesia Postprocedure Evaluation (Signed)
Anesthesia Post Note  Patient: Rebecca [name]  Procedure(s) Performed: CATARACT EXTRACTION PHACO AND INTRAOCULAR LENS PLACEMENT ([place]) (Left Eye)  Patient location during evaluation: Short Stay Anesthesia Type: MAC Level of consciousness: awake and alert and oriented Pain management: pain level controlled Vital Signs Assessment: post-procedure vital signs reviewed and stable Respiratory status: spontaneous breathing Cardiovascular status: stable Postop Assessment: no apparent nausea or vomiting Anesthetic complications: no     Last Vitals:  Vitals:   04/13/18 0711  BP: (!) 117/53  Pulse: 80  Resp: 16  Temp: 36.5 C  SpO2: 93%    Last Pain:  Vitals:   04/13/18 0711  TempSrc: Oral  PainSc: 0-No pain                 Angy Swearengin A

## 2018-04-13 NOTE — Anesthesia Procedure Notes (Signed)
Procedure Name: MAC Date/Time: 04/13/2018 8:27 AM Performed by: Andree Elk Amy A, CRNA Pre-anesthesia Checklist: Patient identified, Emergency Drugs available, Suction available, Timeout performed and Patient being monitored Patient Re-evaluated:Patient Re-evaluated prior to induction Oxygen Delivery Method: Nasal Cannula

## 2018-04-13 NOTE — Discharge Instructions (Signed)
Please discharge patient when stable, will follow up today with Dr. Sapna Padron at the [facility] office immediately following discharge.  Leave shield in place until visit.  All paperwork with discharge instructions will be given at the office. ° ° ° ° ° °Monitored Anesthesia Care, Care After °These instructions provide you with information about caring for yourself after your procedure. Your health care provider may also give you more specific instructions. Your treatment has been planned according to current medical practices, but problems sometimes occur. Call your health care provider if you have any problems or questions after your procedure. °What can I expect after the procedure? °After your procedure, you may: °· Feel sleepy for several hours. °· Feel clumsy and have poor balance for several hours. °· Feel forgetful about what happened after the procedure. °· Have poor judgment for several hours. °· Feel nauseous or vomit. °· Have a sore throat if you had a breathing tube during the procedure. °Follow these instructions at home: °For at least 24 hours after the procedure: ° °  ° °· Have a responsible adult stay with you. It is important to have someone help care for you until you are awake and alert. °· Rest as needed. °· Do not: °? Participate in activities in which you could fall or become injured. °? Drive. °? Use heavy machinery. °? Drink alcohol. °? Take sleeping pills or medicines that cause drowsiness. °? Make important decisions or sign legal documents. °? Take care of children on your own. °Eating and drinking °· Follow the diet that is recommended by your health care provider. °· If you vomit, drink water, juice, or soup when you can drink without vomiting. °· Make sure you have little or no nausea before eating solid foods. °General instructions °· Take over-the-counter and prescription medicines only as told by your health care provider. °· If you have sleep apnea, surgery and certain  medicines can increase your risk for breathing problems. Follow instructions from your health care provider about wearing your sleep device: °? Anytime you are sleeping, including during daytime naps. °? While taking prescription pain medicines, sleeping medicines, or medicines that make you drowsy. °· If you smoke, do not smoke without supervision. °· Keep all follow-up visits as told by your health care provider. This is important. °Contact a health care provider if: °· You keep feeling nauseous or you keep vomiting. °· You feel light-headed. °· You develop a rash. °· You have a fever. °Get help right away if: °· You have trouble breathing. °Summary °· For several hours after your procedure, you may feel sleepy and have poor judgment. °· Have a responsible adult stay with you for at least 24 hours or until you are awake and alert. °This information is not intended to replace advice given to you by your health care provider. Make sure you discuss any questions you have with your health care provider. °Document Released: 06/14/2015 Document Revised: 10/07/2016 Document Reviewed: 06/14/2015 °Elsevier Interactive Patient Education © 2019 [employer] ° °

## 2018-04-16 ENCOUNTER — Encounter (HOSPITAL_COMMUNITY): Payer: Self-pay | Admitting: Ophthalmology

## 2018-04-19 ENCOUNTER — Encounter (HOSPITAL_COMMUNITY): Payer: Self-pay

## 2018-04-20 ENCOUNTER — Encounter (HOSPITAL_COMMUNITY)
Admission: RE | Admit: 2018-04-20 | Discharge: 2018-04-20 | Disposition: A | Discharge status: Home / Self Care | Payer: 59 | Source: Ambulatory Visit | Attending: Ophthalmology | Admitting: Ophthalmology

## 2018-05-17 ENCOUNTER — Encounter (HOSPITAL_COMMUNITY)
Admission: RE | Admit: 2018-05-17 | Discharge: 2018-05-17 | Disposition: A | Discharge status: Home / Self Care | Payer: 59 | Source: Ambulatory Visit | Attending: Ophthalmology | Admitting: Ophthalmology

## 2018-05-17 ENCOUNTER — Other Ambulatory Visit: Payer: Self-pay

## 2018-05-22 ENCOUNTER — Ambulatory Visit (HOSPITAL_COMMUNITY): Payer: 59 | Admitting: Anesthesiology

## 2018-05-22 ENCOUNTER — Encounter (HOSPITAL_COMMUNITY)
Admission: RE | Disposition: A | Discharge status: Home / Self Care | Payer: Self-pay | Source: Home / Self Care | Attending: Ophthalmology

## 2018-05-22 ENCOUNTER — Ambulatory Visit (HOSPITAL_COMMUNITY)
Admission: RE | Admit: 2018-05-22 | Discharge: 2018-05-22 | Disposition: A | Discharge status: Home / Self Care | Payer: 59 | Attending: Ophthalmology | Admitting: Ophthalmology

## 2018-05-22 ENCOUNTER — Encounter (HOSPITAL_COMMUNITY): Payer: Self-pay | Admitting: *Deleted

## 2018-05-22 ENCOUNTER — Other Ambulatory Visit: Payer: Self-pay

## 2018-05-22 DIAGNOSIS — F17200 Nicotine dependence, unspecified, uncomplicated: Secondary | ICD-10-CM | POA: Insufficient documentation

## 2018-05-22 DIAGNOSIS — Z79899 Other long term (current) drug therapy: Secondary | ICD-10-CM | POA: Insufficient documentation

## 2018-05-22 DIAGNOSIS — M069 Rheumatoid arthritis, unspecified: Secondary | ICD-10-CM | POA: Diagnosis not present

## 2018-05-22 DIAGNOSIS — E1136 Type 2 diabetes mellitus with diabetic cataract: Secondary | ICD-10-CM | POA: Diagnosis not present

## 2018-05-22 DIAGNOSIS — Z7984 Long term (current) use of oral hypoglycemic drugs: Secondary | ICD-10-CM | POA: Insufficient documentation

## 2018-05-22 DIAGNOSIS — J449 Chronic obstructive pulmonary disease, unspecified: Secondary | ICD-10-CM | POA: Diagnosis not present

## 2018-05-22 DIAGNOSIS — E7800 Pure hypercholesterolemia, unspecified: Secondary | ICD-10-CM | POA: Diagnosis not present

## 2018-05-22 DIAGNOSIS — G4730 Sleep apnea, unspecified: Secondary | ICD-10-CM | POA: Diagnosis not present

## 2018-05-22 DIAGNOSIS — H259 Unspecified age-related cataract: Secondary | ICD-10-CM | POA: Insufficient documentation

## 2018-05-22 DIAGNOSIS — Z7982 Long term (current) use of aspirin: Secondary | ICD-10-CM | POA: Insufficient documentation

## 2018-05-22 DIAGNOSIS — I10 Essential (primary) hypertension: Secondary | ICD-10-CM | POA: Diagnosis not present

## 2018-05-22 HISTORY — PX: CATARACT EXTRACTION W/PHACO: SHX586

## 2018-05-22 LAB — GLUCOSE, CAPILLARY: Glucose-Capillary: 132 mg/dL — ABNORMAL HIGH (ref 70–99)

## 2018-05-22 SURGERY — PHACOEMULSIFICATION, CATARACT, WITH IOL INSERTION
Anesthesia: Monitor Anesthesia Care | Site: Eye | Laterality: Right

## 2018-05-22 MED ORDER — EPINEPHRINE PF 1 MG/ML IJ SOLN
INTRAOCULAR | Status: DC | PRN
  Administered 2018-05-22: 500 mL

## 2018-05-22 MED ORDER — TETRACAINE HCL 0.5 % OP SOLN
1.0000 [drp] | OPHTHALMIC | Status: AC
  Administered 2018-05-22 (×3): 1 [drp] via OPHTHALMIC

## 2018-05-22 MED ORDER — TETRACAINE HCL 0.5 % OP SOLN: 1.0000 [drp] | OPHTHALMIC | Status: DC

## 2018-05-22 MED ORDER — PROVISC 10 MG/ML IO SOLN
INTRAOCULAR | Status: DC | PRN
  Administered 2018-05-22: 0.85 mL via INTRAOCULAR

## 2018-05-22 MED ORDER — NEOMYCIN-POLYMYXIN-DEXAMETH 3.5-10000-0.1 OP SUSP
OPHTHALMIC | Status: DC | PRN
  Administered 2018-05-22: 1 [drp] via OPHTHALMIC

## 2018-05-22 MED ORDER — MIDAZOLAM HCL 5 MG/5ML IJ SOLN
INTRAMUSCULAR | Status: DC | PRN
  Administered 2018-05-22 (×2): 1 mg via INTRAVENOUS

## 2018-05-22 MED ORDER — CYCLOPENTOLATE-PHENYLEPHRINE 0.2-1 % OP SOLN: 1.0000 [drp] | OPHTHALMIC | Status: DC

## 2018-05-22 MED ORDER — LIDOCAINE HCL 3.5 % OP GEL: 1.0000 "application " | Freq: Once | OPHTHALMIC | Status: DC

## 2018-05-22 MED ORDER — EPINEPHRINE PF 1 MG/ML IJ SOLN
INTRAMUSCULAR | Status: AC
  Filled 2018-05-22: qty 2

## 2018-05-22 MED ORDER — PHENYLEPHRINE HCL 2.5 % OP SOLN: 1.0000 [drp] | OPHTHALMIC | Status: DC

## 2018-05-22 MED ORDER — BSS IO SOLN
INTRAOCULAR | Status: DC | PRN
  Administered 2018-05-22: 15 mL via INTRAOCULAR

## 2018-05-22 MED ORDER — MIDAZOLAM HCL 2 MG/2ML IJ SOLN
INTRAMUSCULAR | Status: AC
  Filled 2018-05-22: qty 2

## 2018-05-22 MED ORDER — LIDOCAINE HCL 3.5 % OP GEL
1.0000 "application " | Freq: Once | OPHTHALMIC | Status: AC
  Administered 2018-05-22: 1 via OPHTHALMIC

## 2018-05-22 MED ORDER — LIDOCAINE HCL (PF) 1 % IJ SOLN
INTRAOCULAR | Status: DC | PRN
  Administered 2018-05-22: 1 mL via OPHTHALMIC

## 2018-05-22 MED ORDER — SODIUM HYALURONATE 23 MG/ML IO SOLN
INTRAOCULAR | Status: DC | PRN
  Administered 2018-05-22: 0.6 mL via INTRAOCULAR

## 2018-05-22 MED ORDER — PHENYLEPHRINE HCL 2.5 % OP SOLN
1.0000 [drp] | OPHTHALMIC | Status: AC
  Administered 2018-05-22 (×3): 1 [drp] via OPHTHALMIC

## 2018-05-22 MED ORDER — POVIDONE-IODINE 5 % OP SOLN
OPHTHALMIC | Status: DC | PRN
  Administered 2018-05-22: 1 via OPHTHALMIC

## 2018-05-22 MED ORDER — CYCLOPENTOLATE-PHENYLEPHRINE 0.2-1 % OP SOLN
1.0000 [drp] | OPHTHALMIC | Status: AC
  Administered 2018-05-22 (×3): 1 [drp] via OPHTHALMIC

## 2018-05-22 SURGICAL SUPPLY — 15 items
CLOTH BEACON ORANGE TIMEOUT ST (SAFETY) ×2 IMPLANT
DEVICE MILOOP (MISCELLANEOUS) IMPLANT
EYE SHIELD UNIVERSAL CLEAR (GAUZE/BANDAGES/DRESSINGS) ×2 IMPLANT
GLOVE BIOGEL PI IND STRL 6.5 (GLOVE) ×2 IMPLANT
GLOVE BIOGEL PI INDICATOR 6.5 (GLOVE) ×2
LENS ALC ACRYL/TECN (Ophthalmic Related) ×2 IMPLANT
MILOOP DEVICE (MISCELLANEOUS)
NEEDLE HYPO 18GX1.5 BLUNT FILL (NEEDLE) ×2 IMPLANT
PAD ARMBOARD 7.5X6 YLW CONV (MISCELLANEOUS) ×2 IMPLANT
RING MALYGIN (MISCELLANEOUS) IMPLANT
SYR TB 1ML LL NO SAFETY (SYRINGE) ×2 IMPLANT
TAPE SURG TRANSPORE 1 IN (GAUZE/BANDAGES/DRESSINGS) ×1 IMPLANT
TAPE SURGICAL TRANSPORE 1 IN (GAUZE/BANDAGES/DRESSINGS) ×1
VISCOELASTIC ADDITIONAL (OPHTHALMIC RELATED) ×2 IMPLANT
WATER STERILE IRR 250ML POUR (IV SOLUTION) ×2 IMPLANT

## 2018-05-22 NOTE — Discharge Instructions (Addendum)
Please discharge patient when stable, will follow up today with Dr. Marisa Hua at the [facility] office immediately following discharge.  Leave shield in place until visit.  All paperwork with discharge instructions will be given at the office.  PATIENT INSTRUCTIONS POST-ANESTHESIA  IMMEDIATELY FOLLOWING SURGERY:  Do not drive or operate machinery for the first twenty four hours after surgery.  Do not make any important decisions for twenty four hours after surgery or while taking narcotic pain medications or sedatives.  If you develop intractable nausea and vomiting or a severe headache please notify your doctor immediately.  FOLLOW-UP:  Please make an appointment with your surgeon as instructed. You do not need to follow up with anesthesia unless specifically instructed to do so.  WOUND CARE INSTRUCTIONS (if applicable):  Keep a dry clean dressing on the anesthesia/puncture wound site if there is drainage.  Once the wound has quit draining you may leave it open to air.  Generally you should leave the bandage intact for twenty four hours unless there is drainage.  If the epidural site drains for more than 36-48 hours please call the anesthesia department.  QUESTIONS?:  Please feel free to call your physician or the hospital operator if you have any questions, and they will be happy to assist you.       Cataract Surgery, Care After This sheet gives you information about how to care for yourself after your procedure. Your health care provider may also give you more specific instructions. If you have problems or questions, contact your health care provider. What can I expect after the procedure? After the procedure, it is common to have:  Itching.  Discomfort.  Fluid discharge.  Sensitivity to light and to touch.  Bruising in or around the eye.  Mild blurred vision. Follow these instructions at home: Eye care   Do not touch or rub your eyes.  Protect your eyes as told by your  health care provider. You may be told to wear a protective eye shield or sunglasses.  Do not put a contact lens into the affected eye or eyes until your health care provider approves.  Keep the area around your eye clean and dry: ? Avoid swimming. ? Do not allow water to hit you directly in the face while showering. ? Keep soap and shampoo out of your eyes.  Check your eye every day for signs of infection. Watch for: ? Redness, swelling, or pain. ? Fluid, blood, or pus. ? Warmth. ? A bad smell. ? Vision that is getting worse. ? Sensitivity that is getting worse. Activity  Do not drive for 24 hours if you were given a sedative during your procedure.  Avoid strenuous activities, such as playing contact sports, for as long as told by your health care provider.  Do not drive or use heavy machinery until your health care provider approves.  Do not bend or lift heavy objects. Bending increases pressure in the eye. You can walk, climb stairs, and do light household chores.  Ask your health care provider when you can return to work. If you work in a dusty environment, you may be advised to wear protective eyewear for a period of time. General instructions  Take or apply over-the-counter and prescription medicines only as told by your health care provider. This includes eye drops.  Keep all follow-up visits as told by your health care provider. This is important. Contact a health care provider if:  You have increased bruising around your eye.  You have pain that is not helped with medicine.  You have a fever.  You have redness, swelling, or pain in your eye.  You have fluid, blood, or pus coming from your incision.  Your vision gets worse.  Your sensitivity to light gets worse. Get help right away if:  You have sudden loss of vision.  You see flashes of light or spots (floaters).  You have severe eye pain.  You develop nausea or vomiting. Summary  After your  procedure, it is common to have itching, discomfort, bruising, fluid discharge, or sensitivity to light.  Follow instructions from your health care provider about caring for your eye after the procedure.  Do not rub your eye after the procedure. You may need to wear eye protection or sunglasses. Do not wear contact lenses. Keep the area around your eye clean and dry.  Avoid activities that require a lot of effort. These include playing sports and lifting heavy objects.  Contact a health care provider if you have increased bruising, pain that does not go away, or a fever. Get help right away if you suddenly lose your vision, see flashes of light or spots, or have severe pain in the eye. This information is not intended to replace advice given to you by your health care provider. Make sure you discuss any questions you have with your health care provider. Document Released: 09/10/2004 Document Revised: 08/21/2017 Document Reviewed: 08/21/2017 Elsevier Interactive Patient Education  2019 [employer].

## 2018-05-22 NOTE — H&P (Signed)
The H and P was reviewed and updated. The patient was examined.  No changes were found after exam.  The surgical eye was marked.  

## 2018-05-22 NOTE — Anesthesia Preprocedure Evaluation (Signed)
Anesthesia Evaluation  Patient identified by MRN, date of birth, ID band Patient awake    Reviewed: Allergy & Precautions, NPO status , Patient's Chart, lab work & pertinent test results, reviewed documented beta blocker date and time   Airway Mallampati: II  TM Distance: >3 FB Neck ROM: Full    Dental no notable dental hx. (+) Teeth Intact   Pulmonary sleep apnea , COPD,  COPD inhaler, Current Smoker,    Pulmonary exam normal breath sounds clear to auscultation       Cardiovascular Exercise Tolerance: Poor hypertension, Pt. on medications and Pt. on home beta blockers negative cardio ROS Normal cardiovascular examII Rhythm:Regular Rate:Normal     Neuro/Psych Depression negative neurological ROS  negative psych ROS   GI/Hepatic Neg liver ROS, GERD  Medicated and Controlled,  Endo/Other  negative endocrine ROS  Renal/GU negative Renal ROS  negative genitourinary   Musculoskeletal negative musculoskeletal ROS (+)   Abdominal   Peds negative pediatric ROS (+)  Hematology negative hematology ROS (+)   Anesthesia Other Findings   Reproductive/Obstetrics negative OB ROS                             Anesthesia Physical Anesthesia Plan  ASA: III  Anesthesia Plan: MAC   Post-op Pain Management:    Induction: Intravenous  PONV Risk Score and Plan:   Airway Management Planned: Nasal Cannula  Additional Equipment:   Intra-op Plan:   Post-operative Plan:   Informed Consent: I have reviewed the patients History and Physical, chart, labs and discussed the procedure including the risks, benefits and alternatives for the proposed anesthesia with the patient or authorized representative who has indicated his/her understanding and acceptance.     Dental advisory given  Plan Discussed with: CRNA  Anesthesia Plan Comments:         Anesthesia Quick Evaluation

## 2018-05-22 NOTE — Transfer of Care (Signed)
Immediate Anesthesia Transfer of Care Note  Patient: Rebecca [name]  Procedure(s) Performed: CATARACT EXTRACTION PHACO AND INTRAOCULAR LENS PLACEMENT RIGHT EYE (CDE: 16.73) (Right Eye)  Patient Location: Short Stay  Anesthesia Type:MAC  Level of Consciousness: awake and patient cooperative  Airway & Oxygen Therapy: Patient Spontanous Breathing  Post-op Assessment: Report given to RN, Post -op Vital signs reviewed and stable and Patient moving all extremities  Post vital signs: Reviewed and stable  Last Vitals:  Vitals Value Taken Time  BP    Temp    Pulse    Resp    SpO2      Last Pain:  Vitals:   05/22/18 0712  TempSrc: Oral  PainSc: 0-No pain      Patients Stated Pain Goal: 8 (09/26/55 5051)  Complications: No apparent anesthesia complications

## 2018-05-22 NOTE — Anesthesia Postprocedure Evaluation (Signed)
Anesthesia Post Note  Patient: Rebecca [name]  Procedure(s) Performed: CATARACT EXTRACTION PHACO AND INTRAOCULAR LENS PLACEMENT RIGHT EYE (CDE: 16.73) (Right Eye)  Patient location during evaluation: Short Stay Anesthesia Type: MAC Level of consciousness: awake and alert Pain management: pain level controlled Vital Signs Assessment: post-procedure vital signs reviewed and stable Respiratory status: spontaneous breathing, nonlabored ventilation and respiratory function stable Cardiovascular status: blood pressure returned to baseline Postop Assessment: no apparent nausea or vomiting Anesthetic complications: no     Last Vitals:  Vitals:   05/22/18 0712  BP: (!) 125/57  Pulse: 69  Resp: 20  Temp: 36.9 C  SpO2: 96%    Last Pain:  Vitals:   05/22/18 0712  TempSrc: Oral  PainSc: 0-No pain                 Barbaraann Avans J

## 2018-05-22 NOTE — Op Note (Signed)
Date of procedure: 05/22/18  Pre-operative diagnosis: Visually significant age-related cataract, Right Eye (H25.811)  Post-operative diagnosis: Visually significant age-related cataract, Right Eye  Procedure: Removal of cataract via phacoemulsification and insertion of intra-ocular lens [employer] Vision PCB00  +21.0D into the capsular bag of the Right Eye  Attending surgeon: Gerda Diss. Pattye Meda, MD, MA  Anesthesia: MAC, Topical Akten  Complications: None  Estimated Blood Loss: <66m (minimal)  Specimens: None  Implants: As above  Indications:  Visually significant age-related cataract, Right Eye  Procedure:  The patient was seen and identified in the pre-operative area. The operative eye was identified and dilated.  The operative eye was marked.  Topical anesthesia was administered to the operative eye.     The patient was then to the operative suite and placed in the supine position.  A timeout was performed confirming the patient, procedure to be performed, and all other relevant information.   The patient's face was prepped and draped in the usual fashion for intra-ocular surgery.  A lid speculum was placed into the operative eye and the surgical microscope moved into place and focused.  A superotemporal paracentesis was created using a 20 gauge paracentesis blade.  Shugarcaine was injected into the anterior chamber.  Viscoelastic was injected into the anterior chamber.  A temporal clear-corneal main wound incision was created using a 2.436mmicrokeratome.  A continuous curvilinear capsulorrhexis was initiated using an irrigating cystitome and completed using capsulorrhexis forceps.  Hydrodissection and hydrodeliniation were performed.  Viscoelastic was injected into the anterior chamber.  A phacoemulsification handpiece and a chopper as a second instrument were used to remove the nucleus and epinucleus. The irrigation/aspiration handpiece was used to remove any remaining cortical  material.   The capsular bag was reinflated with viscoelastic, checked, and found to be intact.  The intraocular lens was inserted into the capsular bag and dialed into place using a Kuglen hook.  The irrigation/aspiration handpiece was used to remove any remaining viscoelastic.  The clear corneal wound and paracentesis wounds were then hydrated and checked with Weck-Cels to be watertight.  The lid-speculum and drape was removed, and the patient's face was cleaned with a wet and dry 4x4.  Maxitrol was instilled in the eye before a clear shield was taped over the eye. The patient was taken to the post-operative care unit in good condition, having tolerated the procedure well.  [facility]-Op Instructions: [facility] and will receive all other orders and instructions.

## 2018-05-23 ENCOUNTER — Encounter (HOSPITAL_COMMUNITY): Payer: Self-pay | Admitting: Ophthalmology

## 2019-05-02 ENCOUNTER — Ambulatory Visit: Payer: 59

## 2021-01-01 ENCOUNTER — Other Ambulatory Visit: Payer: Self-pay | Admitting: Internal Medicine

## 2021-01-01 DIAGNOSIS — R921 Mammographic calcification found on diagnostic imaging of breast: Secondary | ICD-10-CM

## 2021-01-18 ENCOUNTER — Other Ambulatory Visit: Payer: Self-pay

## 2021-01-18 ENCOUNTER — Ambulatory Visit
Admission: RE | Admit: 2021-01-18 | Discharge: 2021-01-18 | Disposition: A | Discharge status: Home / Self Care | Payer: 59 | Source: Ambulatory Visit | Attending: Internal Medicine | Admitting: Internal Medicine

## 2021-01-18 DIAGNOSIS — R921 Mammographic calcification found on diagnostic imaging of breast: Secondary | ICD-10-CM

## 2021-01-18 DIAGNOSIS — C50919 Malignant neoplasm of unspecified site of unspecified female breast: Secondary | ICD-10-CM

## 2021-01-18 HISTORY — DX: Malignant neoplasm of unspecified site of unspecified female breast: C50.919

## 2021-01-18 IMAGING — MG MM BREAST BX W/ LOC DEV EA AD LESION IMAG BX SPEC STEREO GUIDE*R
7 of 14 series · 7 of 26 positions shown · non-contrast
Comparison: Previous exams.
COMPARISON: Previous exams.

Addendum:
CLINICAL DATA: Stereotactic biopsy of right breast calcifications

EXAM:
RIGHT BREAST STEREOTACTIC CORE NEEDLE BIOPSY

[R (1 of 7)]
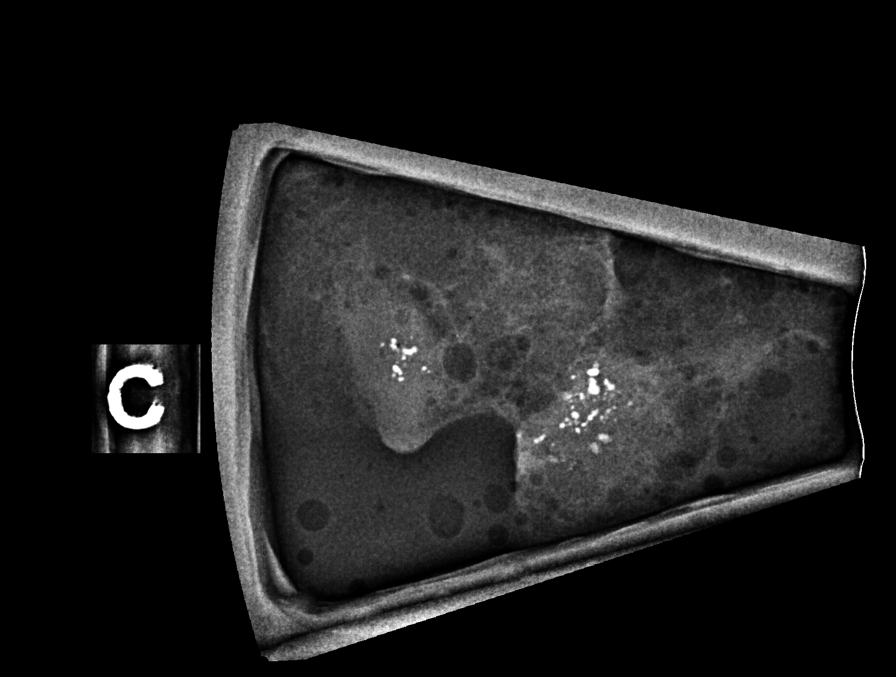

[R (2 of 7)]
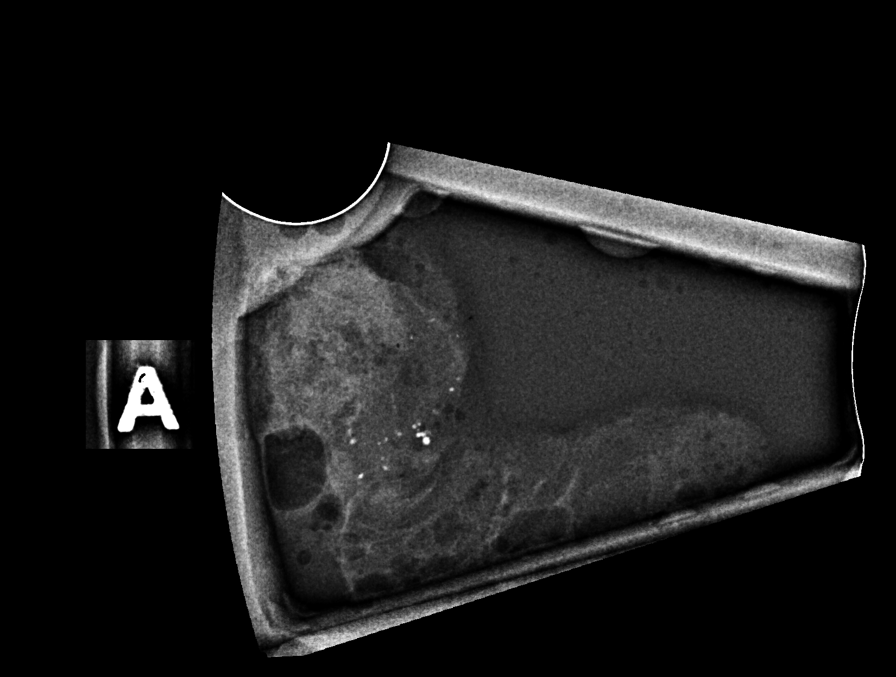

[R (3 of 7)]
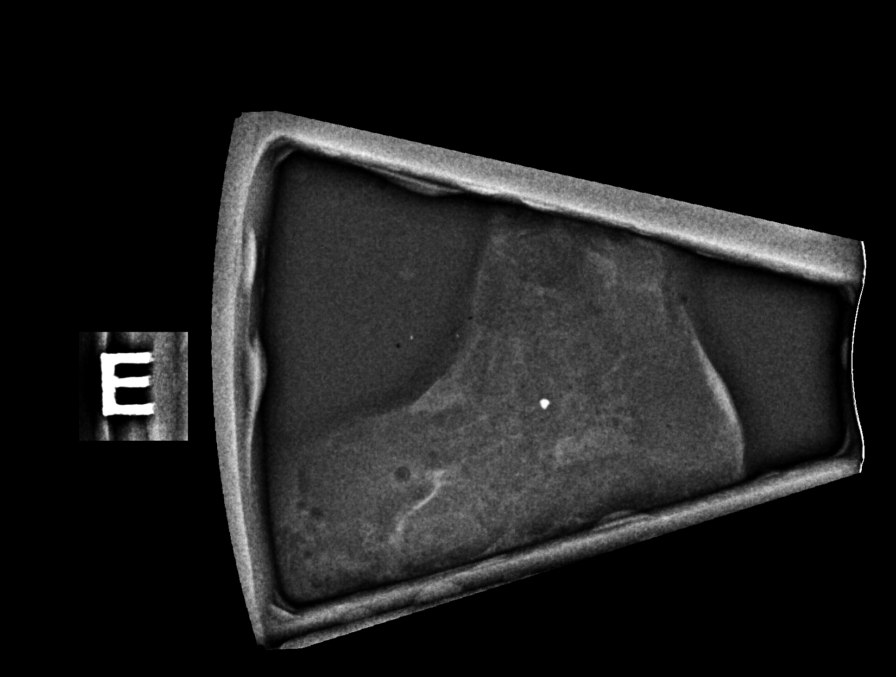

[R (4 of 7)]
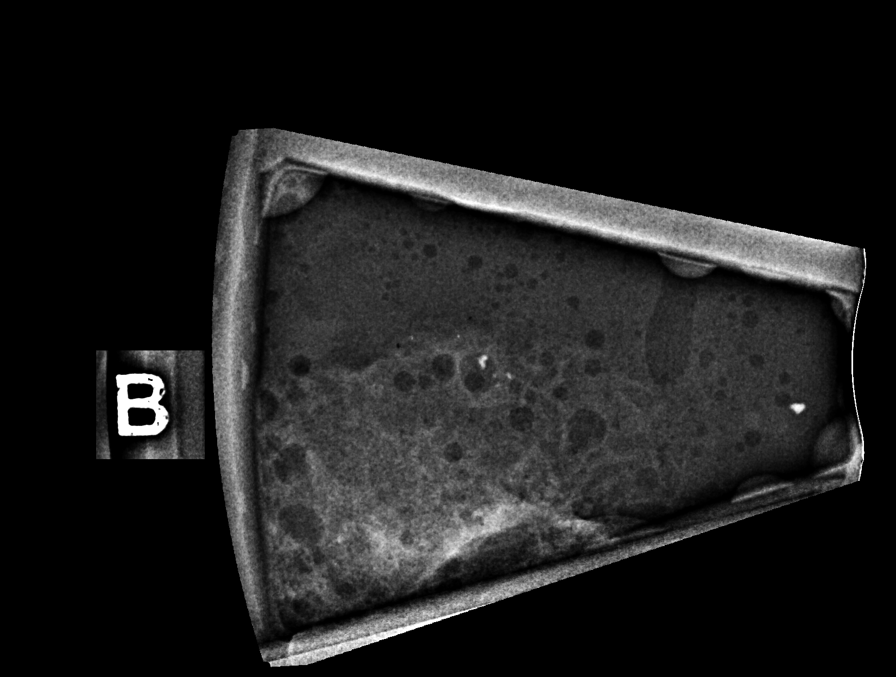

[R (5 of 7)]
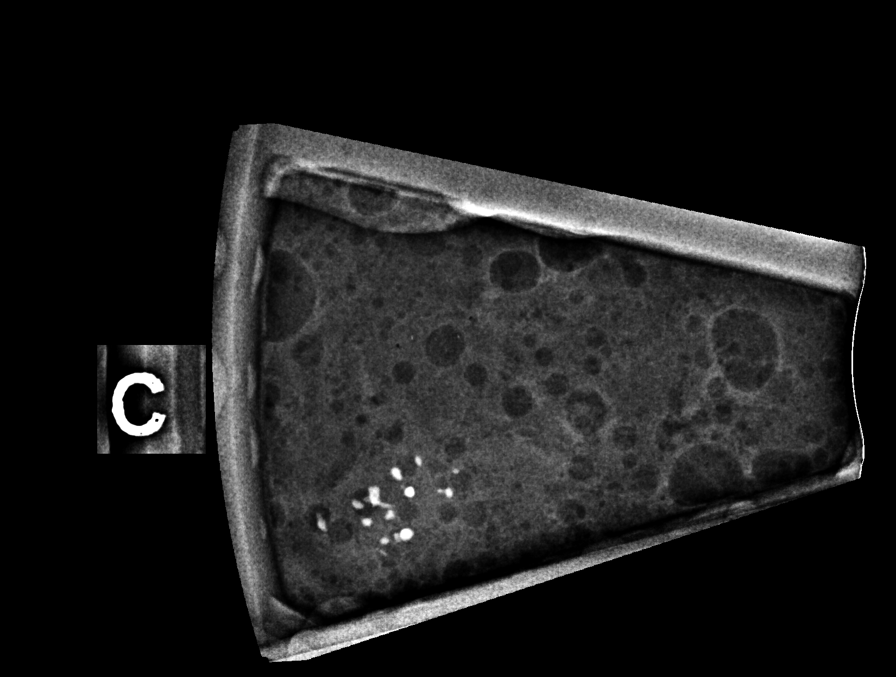

[R (6 of 7)]
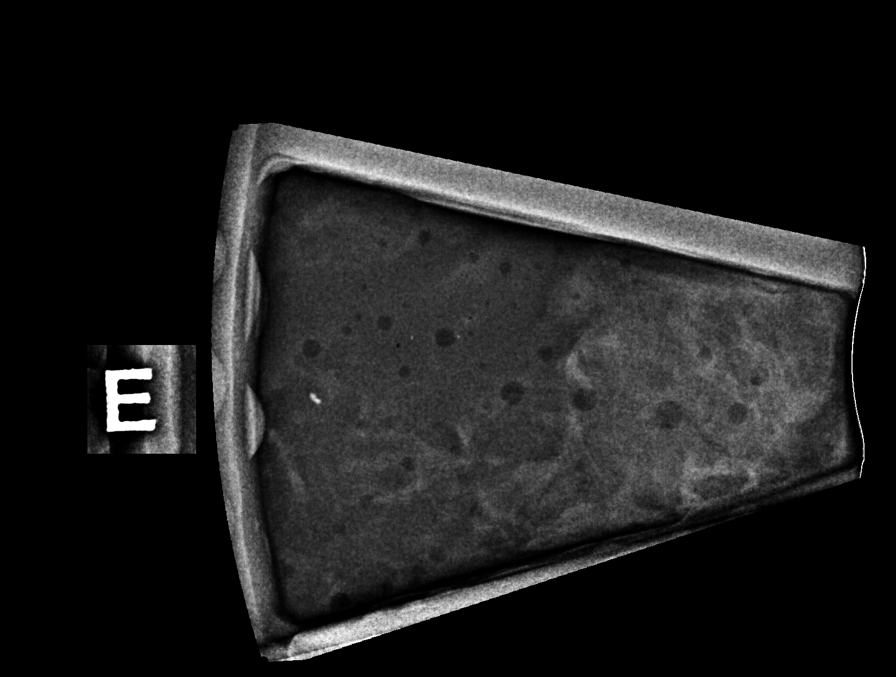

[R (7 of 7)]
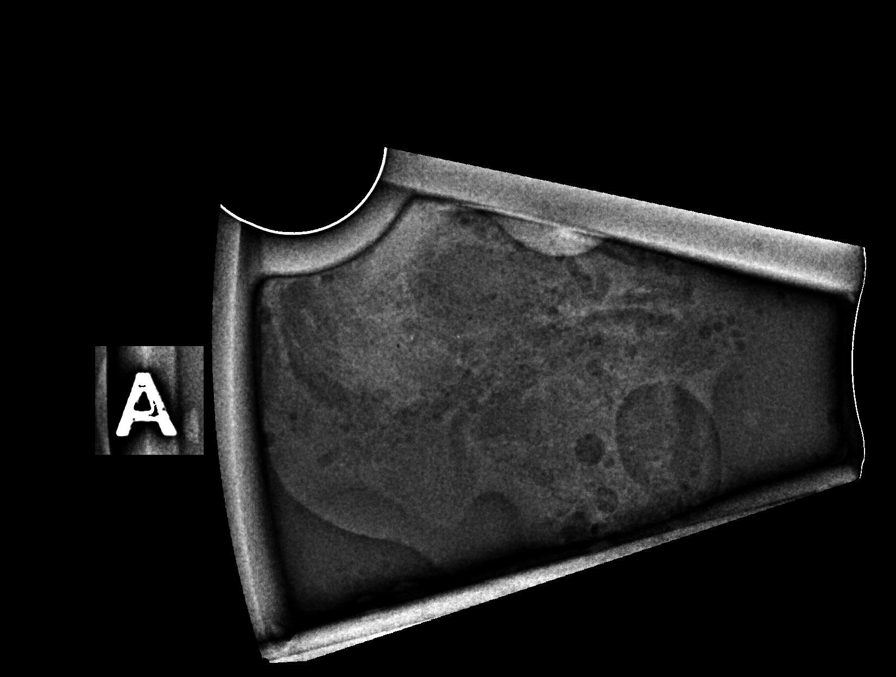

[7 of 26 positions shown; findings below may reference images not displayed]



Using sterile technique and 1% Lidocaine as local anesthetic, under
stereotactic guidance, a 9 gauge vacuum assisted device was used to
perform core needle biopsy of calcifications in the lower outer
right breast using a lateral approach. Specimen radiograph was
performed showing calcifications in several specimens. Specimens
with calcifications are identified for pathology.

Lesion quadrant: Lower outer right breast

At the conclusion of the procedure, an X shaped tissue marker clip
was deployed into the biopsy cavity. Follow-up 2-view mammogram was
performed and dictated separately.

Using sterile technique and 1% Lidocaine as local anesthetic, under
stereotactic guidance, a 9 gauge vacuum assisted device was used to
perform core needle biopsy of calcifications in the lower inner
right breast using a lateral approach. Specimen radiograph was
performed showing calcifications in several core specimens.
Specimens with calcifications are identified for pathology.

Lesion quadrant: Lower inner right breast

At the conclusion of the procedure, a coil shaped tissue marker clip
was deployed into the biopsy cavity. Follow-up 2-view mammogram was
performed and dictated separately.
IMPRESSION: Stereotactic-guided biopsy of calcifications in the lower outer
right breast and the lower inner right breast. No apparent
complications.

ADDENDUM:
Pathology revealed INTERMEDIATE GRADE DUCTAL CARCINOMA IN SITU WITH
CALCIFICATIONS AND NECROSIS- LOBULAR NEOPLASIA (ATYPICAL LOBULAR
HYPERPLASIA) of the RIGHT breast, lower outer,( X clip). This was
found to be concordant by Dr. SEJOUR.

Pathology revealed COMPLEX SCLEROSING LESION WITH USUAL DUCTAL
HYPERPLASIA AND CALCIFICATIONS of the RIGHT breast, lower inner,
(coil clip). This was found to be concordant by Dr. SEJOUR,
with excision recommended.

Pathology results were discussed with the patient by telephone. The
patient reported doing well after the biopsies with tenderness at
the sites. Post biopsy instructions and care were reviewed and
questions were answered. The patient was encouraged to call The

Per patient request, surgical consultation has been arranged with
Dr. SEJOUR at [REDACTED] on [DATE].

Recommend breast MRI with at least two additional biopsies based on
MRI findings or remaining calcifications if breast conservation
being considered.

Pathology results reported by SEJOUR RN on [DATE].



Using sterile technique and 1% Lidocaine as local anesthetic, under
stereotactic guidance, a 9 gauge vacuum assisted device was used to
perform core needle biopsy of calcifications in the lower outer
right breast using a lateral approach. Specimen radiograph was
performed showing calcifications in several specimens. Specimens
with calcifications are identified for pathology.

Lesion quadrant: Lower outer right breast

At the conclusion of the procedure, an X shaped tissue marker clip
was deployed into the biopsy cavity. Follow-up 2-view mammogram was
performed and dictated separately.

Using sterile technique and 1% Lidocaine as local anesthetic, under
stereotactic guidance, a 9 gauge vacuum assisted device was used to
perform core needle biopsy of calcifications in the lower inner
right breast using a lateral approach. Specimen radiograph was
performed showing calcifications in several core specimens.
Specimens with calcifications are identified for pathology.

Lesion quadrant: Lower inner right breast

At the conclusion of the procedure, a coil shaped tissue marker clip
was deployed into the biopsy cavity. Follow-up 2-view mammogram was
performed and dictated separately.
IMPRESSION: Stereotactic-guided biopsy of calcifications in the lower outer
right breast and the lower inner right breast. No apparent
complications.

## 2021-01-18 IMAGING — MG MM BREAST BX W/ LOC DEV 1ST LESION IMAGE BX SPEC STEREO GUIDE*R*
2 of 8 series · 2 of 36 positions shown · non-contrast
Comparison: Previous exams.
COMPARISON: Previous exams.

Addendum:
CLINICAL DATA: Stereotactic biopsy of right breast calcifications

EXAM:
RIGHT BREAST STEREOTACTIC CORE NEEDLE BIOPSY

[R LM]
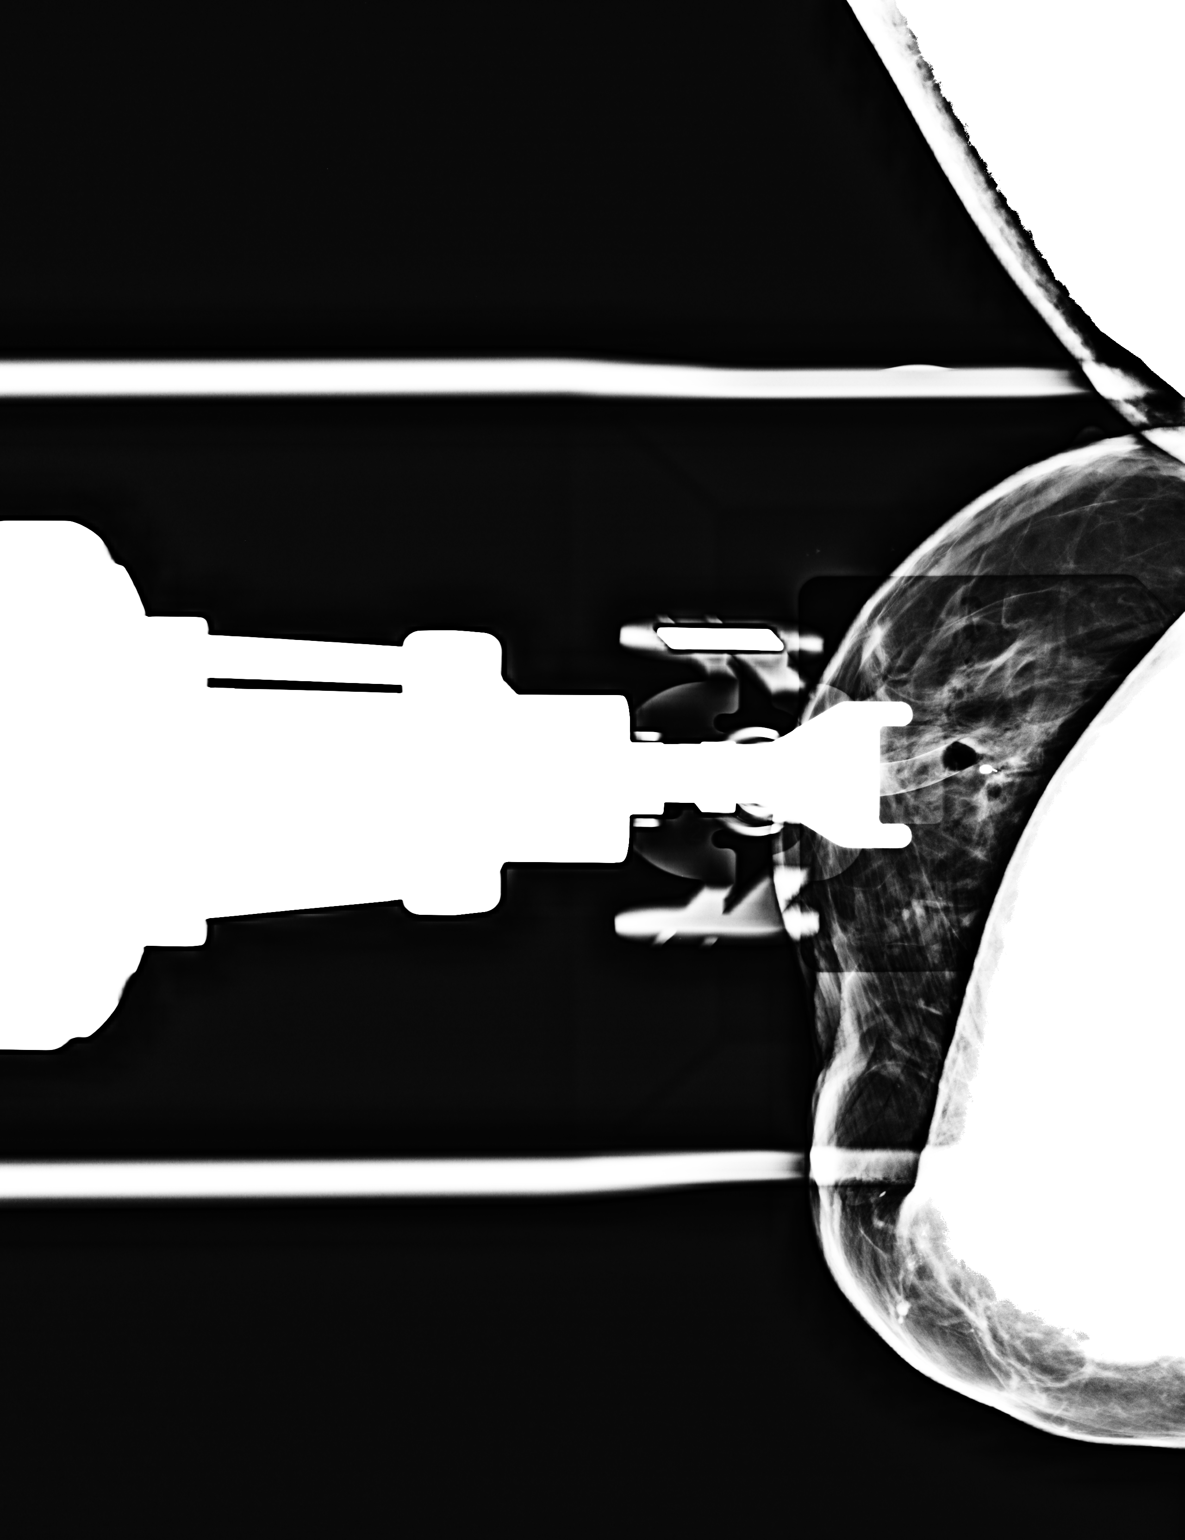

[R LM tomo · tomo slice 23/46.0]
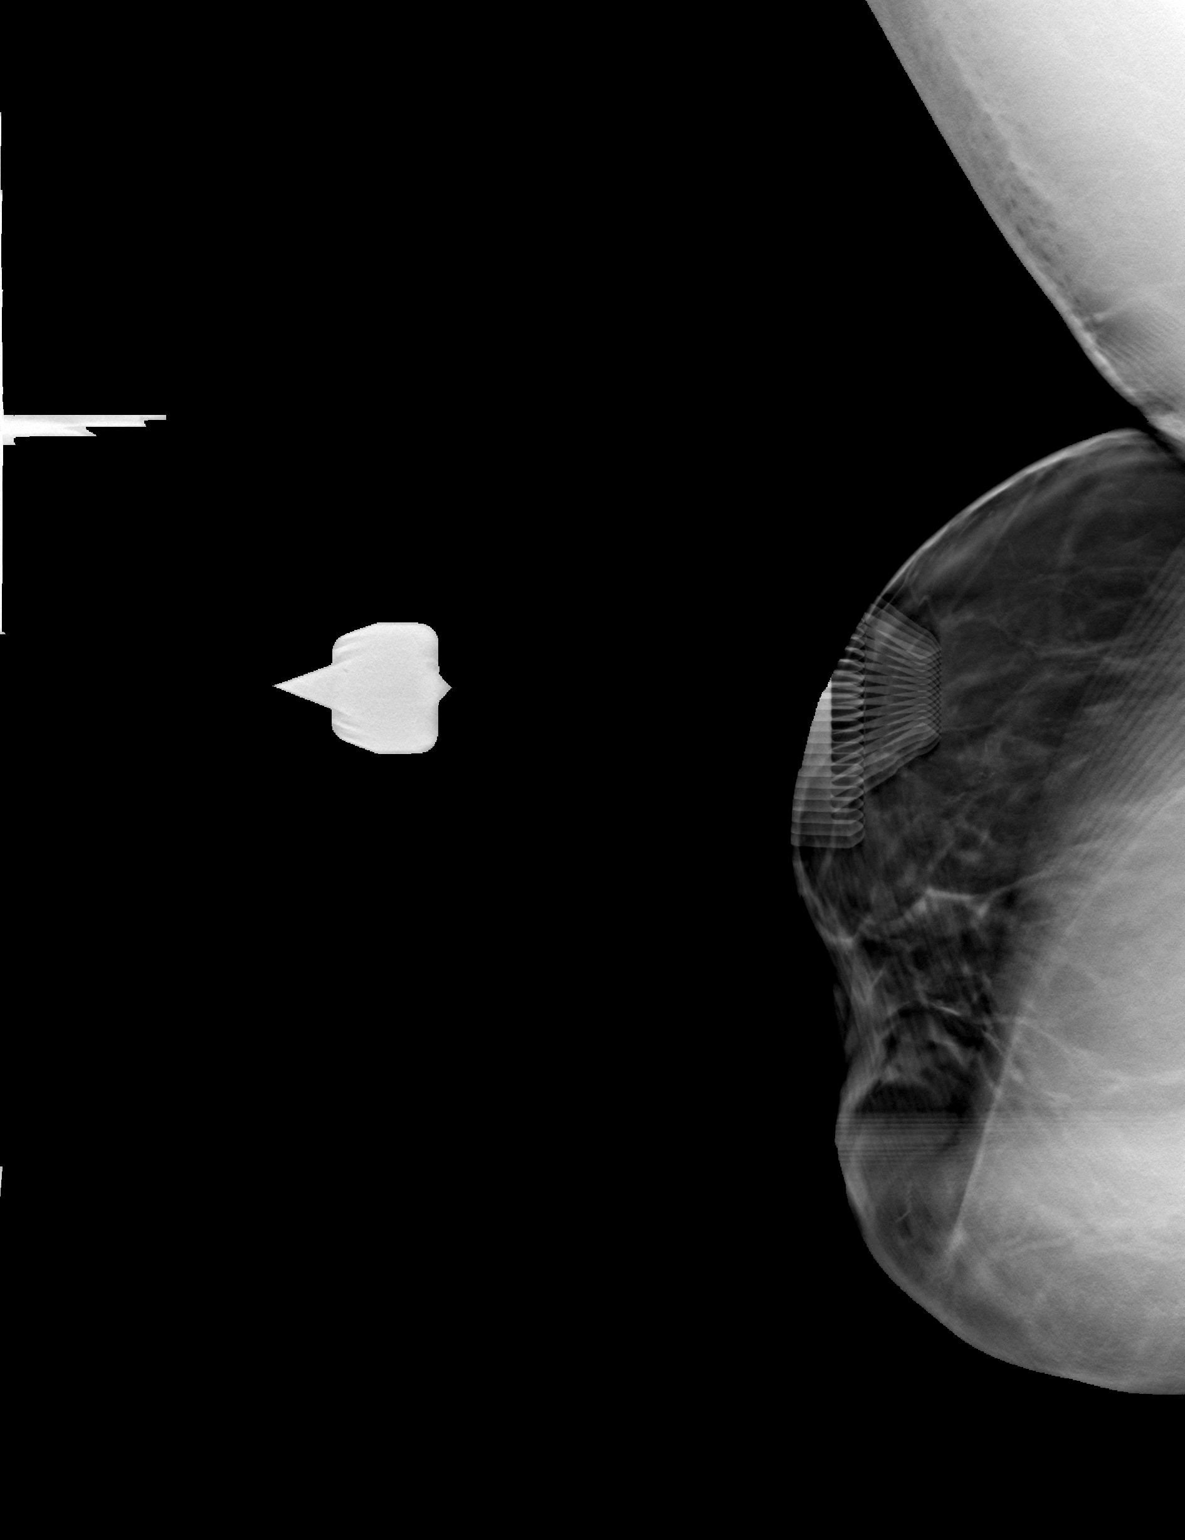

[2 of 36 positions shown; findings below may reference images not displayed]



Using sterile technique and 1% Lidocaine as local anesthetic, under
stereotactic guidance, a 9 gauge vacuum assisted device was used to
perform core needle biopsy of calcifications in the lower outer
right breast using a lateral approach. Specimen radiograph was
performed showing calcifications in several specimens. Specimens
with calcifications are identified for pathology.

Lesion quadrant: Lower outer right breast

At the conclusion of the procedure, an X shaped tissue marker clip
was deployed into the biopsy cavity. Follow-up 2-view mammogram was
performed and dictated separately.

Using sterile technique and 1% Lidocaine as local anesthetic, under
stereotactic guidance, a 9 gauge vacuum assisted device was used to
perform core needle biopsy of calcifications in the lower inner
right breast using a lateral approach. Specimen radiograph was
performed showing calcifications in several core specimens.
Specimens with calcifications are identified for pathology.

Lesion quadrant: Lower inner right breast

At the conclusion of the procedure, a coil shaped tissue marker clip
was deployed into the biopsy cavity. Follow-up 2-view mammogram was
performed and dictated separately.
IMPRESSION: Stereotactic-guided biopsy of calcifications in the lower outer
right breast and the lower inner right breast. No apparent
complications.

ADDENDUM:
Pathology revealed INTERMEDIATE GRADE DUCTAL CARCINOMA IN SITU WITH
CALCIFICATIONS AND NECROSIS- LOBULAR NEOPLASIA (ATYPICAL LOBULAR
HYPERPLASIA) of the RIGHT breast, lower outer,( X clip). This was
found to be concordant by Dr. SEJOUR.

Pathology revealed COMPLEX SCLEROSING LESION WITH USUAL DUCTAL
HYPERPLASIA AND CALCIFICATIONS of the RIGHT breast, lower inner,
(coil clip). This was found to be concordant by Dr. SEJOUR,
with excision recommended.

Pathology results were discussed with the patient by telephone. The
patient reported doing well after the biopsies with tenderness at
the sites. Post biopsy instructions and care were reviewed and
questions were answered. The patient was encouraged to call The

Per patient request, surgical consultation has been arranged with
Dr. SEJOUR at [REDACTED] on [DATE].

Recommend breast MRI with at least two additional biopsies based on
MRI findings or remaining calcifications if breast conservation
being considered.

Pathology results reported by SEJOUR RN on [DATE].



Using sterile technique and 1% Lidocaine as local anesthetic, under
stereotactic guidance, a 9 gauge vacuum assisted device was used to
perform core needle biopsy of calcifications in the lower outer
right breast using a lateral approach. Specimen radiograph was
performed showing calcifications in several specimens. Specimens
with calcifications are identified for pathology.

Lesion quadrant: Lower outer right breast

At the conclusion of the procedure, an X shaped tissue marker clip
was deployed into the biopsy cavity. Follow-up 2-view mammogram was
performed and dictated separately.

Using sterile technique and 1% Lidocaine as local anesthetic, under
stereotactic guidance, a 9 gauge vacuum assisted device was used to
perform core needle biopsy of calcifications in the lower inner
right breast using a lateral approach. Specimen radiograph was
performed showing calcifications in several core specimens.
Specimens with calcifications are identified for pathology.

Lesion quadrant: Lower inner right breast

At the conclusion of the procedure, a coil shaped tissue marker clip
was deployed into the biopsy cavity. Follow-up 2-view mammogram was
performed and dictated separately.
IMPRESSION: Stereotactic-guided biopsy of calcifications in the lower outer
right breast and the lower inner right breast. No apparent
complications.

## 2021-02-08 ENCOUNTER — Inpatient Hospital Stay
Admission: RE | Admit: 2021-02-08 | Discharge: 2021-02-08 | Disposition: A | Discharge status: Home / Self Care | Payer: Self-pay | Source: Ambulatory Visit | Attending: Radiation Oncology | Admitting: Radiation Oncology

## 2021-02-08 ENCOUNTER — Telehealth: Payer: Self-pay | Admitting: Hematology and Oncology

## 2021-02-08 ENCOUNTER — Other Ambulatory Visit: Payer: Self-pay | Admitting: Surgery

## 2021-02-08 ENCOUNTER — Other Ambulatory Visit: Payer: Self-pay | Admitting: Radiation Oncology

## 2021-02-08 DIAGNOSIS — D0511 Intraductal carcinoma in situ of right breast: Secondary | ICD-10-CM

## 2021-02-08 NOTE — Telephone Encounter (Signed)
Scheduled appt per 12/5 referral. Pt is aware of appt date and time.

## 2021-02-12 NOTE — Progress Notes (Signed)
New Breast Cancer Diagnosis:Right Breast LOQ, LIQ  Did patient present with symptoms (if so, please note symptoms) or screening mammography?:Screening Calcifications up to 11 cm in the right breast.,   MRI Breast 02/25/2021:  Location and Extent of disease :right breast. Located in the lower outer position, measured  0.3 cm in greatest dimension. Adenopathy no.  Histology per Pathology Report: grade Intermediate, DCIS within the lower outer right breast calcifications with associated necrosis.  Lobular neoplasia was also noted. 01/18/2021   Receptor Status: ER(positive), PR (positive), Her2-neu (), Ki-(%)   Surgeon and surgical plan, if any:  Dr. Ninfa Linden 02/02/2021 -Given the extent of disease, if she desires breast conservation, it is recommended she undergo bilateral breast MRIs with the least 2 more biopsies.  -Given her medical history and COPD, she is uncertain of how she wants to proceed, whether or not she wants surgery.  -We will go ahead and order the MRIs and I will refer her to both medical and radiation oncology.  -I will then call back with the results of the MRI we will determine how to proceed.  -She may end up wanting bilateral mastectomies without reconstruction.   Medical oncologist, treatment if any:   Dr. Chryl Heck 02/24/2021   Family History of Breast/Ovarian/Prostate Cancer: Dad had prostate, Maternal cousin had breast, Maternal Aunt had breast.  Lymphedema issues, if any:  No    Pain issues, if any: No    SAFETY ISSUES: Prior radiation? No Pacemaker/ICD? No Possible current pregnancy? Hysterectomy Is the patient on methotrexate? No  Current Complaints / other details:

## 2021-02-17 ENCOUNTER — Other Ambulatory Visit: Payer: Self-pay

## 2021-02-17 ENCOUNTER — Ambulatory Visit: Payer: 59 | Admitting: Radiation Oncology

## 2021-02-17 ENCOUNTER — Ambulatory Visit
Admission: RE | Admit: 2021-02-17 | Discharge: 2021-02-17 | Disposition: A | Discharge status: Home / Self Care | Payer: 59 | Source: Ambulatory Visit | Attending: Radiation Oncology | Admitting: Radiation Oncology

## 2021-02-17 ENCOUNTER — Ambulatory Visit: Payer: 59

## 2021-02-17 ENCOUNTER — Encounter: Payer: Self-pay | Admitting: Radiation Oncology

## 2021-02-17 DIAGNOSIS — F17210 Nicotine dependence, cigarettes, uncomplicated: Secondary | ICD-10-CM | POA: Diagnosis not present

## 2021-02-17 DIAGNOSIS — Z79899 Other long term (current) drug therapy: Secondary | ICD-10-CM | POA: Insufficient documentation

## 2021-02-17 DIAGNOSIS — E785 Hyperlipidemia, unspecified: Secondary | ICD-10-CM | POA: Insufficient documentation

## 2021-02-17 DIAGNOSIS — J449 Chronic obstructive pulmonary disease, unspecified: Secondary | ICD-10-CM | POA: Insufficient documentation

## 2021-02-17 DIAGNOSIS — Z7984 Long term (current) use of oral hypoglycemic drugs: Secondary | ICD-10-CM | POA: Diagnosis not present

## 2021-02-17 DIAGNOSIS — G4730 Sleep apnea, unspecified: Secondary | ICD-10-CM | POA: Diagnosis not present

## 2021-02-17 DIAGNOSIS — I10 Essential (primary) hypertension: Secondary | ICD-10-CM | POA: Diagnosis not present

## 2021-02-17 DIAGNOSIS — Z7982 Long term (current) use of aspirin: Secondary | ICD-10-CM | POA: Insufficient documentation

## 2021-02-17 DIAGNOSIS — M810 Age-related osteoporosis without current pathological fracture: Secondary | ICD-10-CM | POA: Diagnosis not present

## 2021-02-17 DIAGNOSIS — D0511 Intraductal carcinoma in situ of right breast: Secondary | ICD-10-CM | POA: Diagnosis not present

## 2021-02-17 DIAGNOSIS — Z170 Estrogen receptor positive status [ER+]: Secondary | ICD-10-CM | POA: Diagnosis not present

## 2021-02-17 DIAGNOSIS — K219 Gastro-esophageal reflux disease without esophagitis: Secondary | ICD-10-CM | POA: Insufficient documentation

## 2021-02-17 DIAGNOSIS — Z803 Family history of malignant neoplasm of breast: Secondary | ICD-10-CM | POA: Diagnosis not present

## 2021-02-17 NOTE — Progress Notes (Signed)
Radiation Oncology         [phone] ________________________________  Name: Rebecca [name]        MRN: [id number]  Date of Service: 02/17/2021 DOB: Apr 22, 1945  KZ:SWFU, Weldon Picking, MD  Coralie Keens, MD     REFERRING PHYSICIAN: Coralie Keens, MD   DIAGNOSIS: The encounter diagnosis was Ductal carcinoma in situ (DCIS) of right breast.   HISTORY OF PRESENT ILLNESS: Rebecca [name] is a 75 y.o. female seen at the request of Dr. Ninfa Linden for new diagnosis of right breast cancer.  The patient was evaluated at an outside facility and found by screening mammogram to have a group of calcifications up to 11 cm in the right breast.  It appears that these findings prompted biopsy on 01/18/2021 showing intermediate grade DCIS within the lower outer right breast calcifications with associated necrosis.  Lobular neoplasia was also noted.  In the lower inner quadrant where the biopsy was also performed showed complex sclerosing lesion with usual ductal hyperplasia and calcifications.  Her tumor was ER/PR positive.  Dr. Ninfa Linden recommended an MRI for extent of disease which is scheduled for 02/25/2021.  Depending on the results of this and additional needs for sampling, she will either proceed with breast conservation versus mastectomy procedure.  She has also verbalized that if she needed a mastectomy she would desire bilateral mastectomies without reconstruction. She's seen today to discuss treatment recommendations of her cancer.     PREVIOUS RADIATION THERAPY: No   PAST MEDICAL HISTORY:  Past Medical History:  Diagnosis Date   Breast cancer ([place]) 01/18/2021   COPD (chronic obstructive pulmonary disease) (HCC)    Depression    GERD (gastroesophageal reflux disease)    Hyperlipemia    Hypertension    Osteoporosis    Sleep apnea    has not taken test,snores, may have it       PAST SURGICAL HISTORY: Past Surgical History:  Procedure Laterality Date   ABDOMINAL HYSTERECTOMY     BREAST  SURGERY     lt lumpectomy-not cancer   CARPAL TUNNEL RELEASE  10/26/2011   Procedure: CARPAL TUNNEL RELEASE;  Surgeon: Wynonia Sours, MD;  Location: [facility];  Service: Orthopedics;  Laterality: Left;   CARPECTOMY  10/26/2011   Procedure: CARPECTOMY;  Surgeon: Wynonia Sours, MD;  Location: [facility];  Service: Orthopedics;  Laterality: Left;  proximal row carpectomy left wrist    CATARACT EXTRACTION W/PHACO Left 04/13/2018   Procedure: CATARACT EXTRACTION PHACO AND INTRAOCULAR LENS PLACEMENT ([place]);  Surgeon: Baruch Goldmann, MD;  Location: [facility];  Service: Ophthalmology;  Laterality: Left;  CDE: 14.92   CATARACT EXTRACTION W/PHACO Right 05/22/2018   Procedure: CATARACT EXTRACTION PHACO AND INTRAOCULAR LENS PLACEMENT RIGHT EYE (CDE: 16.73);  Surgeon: Baruch Goldmann, MD;  Location: [facility];  Service: Ophthalmology;  Laterality: Right;   COLONOSCOPY     KNEE ARTHROSCOPY     right   TONSILLECTOMY  x2     FAMILY HISTORY:  Family History  Problem Relation Age of Onset   Prostate cancer Father    Breast cancer Maternal Aunt    Breast cancer Cousin      SOCIAL HISTORY:  reports that she has been smoking. She has been smoking an average of 1.5 packs per day. She has never used smokeless tobacco. She reports that she does not drink alcohol and does not use drugs. The patient is married and lives in [place]: Patient has no known  allergies.   MEDICATIONS:  Current Outpatient Medications  Medication Sig Dispense Refill   aspirin 81 MG chewable tablet Chew 81 mg by mouth daily.     buPROPion (WELLBUTRIN SR) 100 MG 12 hr tablet Take 100 mg by mouth 2 (two) times daily.     cetirizine (ZYRTEC) 10 MG tablet Take 10 mg by mouth daily.     cholecalciferol (VITAMIN D3) 25 MCG (1000 UT) tablet Take 1,000 Units by mouth daily.     docusate sodium (COLACE) 100 MG capsule Take 100 mg by mouth 2 (two) times daily.     hydrochlorothiazide (HYDRODIURIL) 25 MG tablet  Take 25 mg by mouth daily.     ipratropium-albuterol (DUONEB) 0.5-2.5 (3) MG/3ML SOLN Take 3 mLs by nebulization every 6 (six) hours as needed.     losartan (COZAAR) 25 MG tablet Take 25 mg by mouth daily.     metFORMIN (GLUCOPHAGE) 500 MG tablet Take 1,000 mg by mouth 2 (two) times daily with a meal.     metoprolol succinate (TOPROL-XL) 50 MG 24 hr tablet Take 50 mg by mouth daily. Take with or immediately following a meal.     Multiple Vitamin (MULTIVITAMIN WITH MINERALS) TABS tablet Take 1 tablet by mouth daily.     Omega-3 1000 MG CAPS Take 1,000 mg by mouth daily.     Plecanatide 3 MG TABS Take 3 mg by mouth daily as needed (IBS).     Polyethyl Glycol-Propyl Glycol (SYSTANE OP) Place 1 drop into both eyes daily as needed (dry eyes).     Semaglutide,0.25 or 0.5MG /DOS, 2 MG/1.5ML SOPN Inject 0.25 mg into the skin every 7 (seven) days.     simvastatin (ZOCOR) 10 MG tablet Take 10 mg by mouth daily.     budesonide-formoterol (SYMBICORT) 80-4.5 MCG/ACT inhaler Inhale 2 puffs into the lungs 2 (two) times daily. (Patient not taking: Reported on 02/17/2021)     No current facility-administered medications for this encounter.     REVIEW OF SYSTEMS: On review of systems, the patient reports that she is doing pretty well. She has chronic low back pain, and reports she is trying to find a way to be successful at tobacco cessation. She is not having any breast pain. No other complaints are noted.      PHYSICAL EXAM:  Wt Readings from Last 3 Encounters:  02/17/21 223 lb 12.8 oz (101.5 kg)  05/22/18 209 lb 7 oz (95 kg)  04/06/18 210 lb (95.3 kg)   Temp Readings from Last 3 Encounters:  02/17/21 97.9 F (36.6 C)  05/22/18 98.5 F (36.9 C) (Oral)  04/13/18 98 F (36.7 C) (Oral)   BP Readings from Last 3 Encounters:  02/17/21 (!) 151/63  05/22/18 (!) 123/50  04/13/18 (!) 115/49   Pulse Readings from Last 3 Encounters:  02/17/21 (!) 135  05/22/18 70  04/13/18 78    In general this is  a well appearing caucasian female in no acute distress. She's alert and oriented x4 and appropriate throughout the examination. Cardiopulmonary assessment is negative for acute distress and she exhibits normal effort. Bilateral breast exam is deferred.    ECOG = 0  0 - Asymptomatic (Fully active, able to carry on all predisease activities without restriction)  1 - Symptomatic but completely ambulatory (Restricted in physically strenuous activity but ambulatory and able to carry out work of a light or sedentary nature. For example, light housework, office work)  2 - Symptomatic, <50% in bed during the day (Ambulatory and  capable of all self care but unable to carry out any work activities. Up and about more than 50% of waking hours)  3 - Symptomatic, >50% in bed, but not bedbound (Capable of only limited self-care, confined to bed or chair 50% or more of waking hours)  4 - Bedbound (Completely disabled. Cannot carry on any self-care. Totally confined to bed or chair)  5 - Death   Eustace Pen MM, Creech RH, Tormey DC, et al. [id number]). "Toxicity and response criteria of the [facility]". [place] Oncol. 5 (6): 649-55    LABORATORY DATA:  Lab Results  Component Value Date   WBC 10.2 04/06/2018   HGB 12.2 04/06/2018   HCT 38.3 04/06/2018   MCV 88.2 04/06/2018   PLT 333 04/06/2018   Lab Results  Component Value Date   NA 137 04/06/2018   K 3.8 04/06/2018   CL 101 04/06/2018   CO2 27 04/06/2018   No results found for: ALT, AST, GGT, ALKPHOS, BILITOT    RADIOGRAPHY: MM CLIP PLACEMENT RIGHT  Result Date: 01/18/2021 CLINICAL DATA:  Evaluate biopsy markers EXAM: 3D DIAGNOSTIC RIGHT MAMMOGRAM POST STEREOTACTIC BIOPSY COMPARISON:  Previous exam(s). FINDINGS: 3D Mammographic images were obtained following stereotactic guided biopsy of lower outer right breast calcifications and lower inner right breast calcifications. The X shaped clip migrated at least 7.5 cm medial  to the site of the lower outer calcifications biopsied in the right breast. Coil shaped clip marks the site of the lower inner right breast calcifications which were biopsy. IMPRESSION: The X shaped clip migrated at least 7.5 cm medial to the site of the lower outer calcifications biopsied in the right breast. Coil shaped clip marks the site of the lower inner right breast calcifications which were biopsy. Final Assessment: Post Procedure Mammograms for Marker Placement Electronically Signed   By: Dorise Bullion III M.D.   On: 01/18/2021 11:57  MM RT BREAST BX W LOC DEV 1ST LESION IMAGE BX SPEC STEREO GUIDE  Addendum Date: 01/21/2021   ADDENDUM REPORT: 01/21/2021 08:21 ADDENDUM: Pathology revealed INTERMEDIATE GRADE DUCTAL CARCINOMA IN SITU WITH CALCIFICATIONS AND NECROSIS- LOBULAR NEOPLASIA (ATYPICAL LOBULAR HYPERPLASIA) of the RIGHT breast, lower outer,( X clip). This was found to be concordant by Dr. Dorise Bullion. Pathology revealed COMPLEX SCLEROSING LESION WITH USUAL DUCTAL HYPERPLASIA AND CALCIFICATIONS of the RIGHT breast, lower inner, (coil clip). This was found to be concordant by Dr. Dorise Bullion, with excision recommended. Pathology results were discussed with the patient by telephone. The patient reported doing well after the biopsies with tenderness at the sites. Post biopsy instructions and care were reviewed and questions were answered. The patient was encouraged to call [facility] for any additional concerns. Per patient request, surgical consultation has been arranged with Dr. Nedra Hai at [facility] on February 02, 2021. Recommend breast MRI with at least two additional biopsies based on MRI findings or remaining calcifications if breast conservation being considered. Pathology results reported by Stacie Acres RN on 01/20/2021. Electronically Signed   By: Dorise Bullion III M.D.   On: 01/21/2021 08:21   Result Date: 01/21/2021 CLINICAL  DATA:  Stereotactic biopsy of right breast calcifications EXAM: RIGHT BREAST STEREOTACTIC CORE NEEDLE BIOPSY COMPARISON:  Previous exams. FINDINGS: The patient and I discussed the procedure of stereotactic-guided biopsy including benefits and alternatives. We discussed the high likelihood of a successful procedure. We discussed the risks of the procedure including infection, bleeding, tissue injury, clip migration, and  inadequate sampling. Informed written consent was given. The usual time out protocol was performed immediately prior to the procedure. Using sterile technique and 1% Lidocaine as local anesthetic, under stereotactic guidance, a 9 gauge vacuum assisted device was used to perform core needle biopsy of calcifications in the lower outer right breast using a lateral approach. Specimen radiograph was performed showing calcifications in several specimens. Specimens with calcifications are identified for pathology. Lesion quadrant: Lower outer right breast At the conclusion of the procedure, an X shaped tissue marker clip was deployed into the biopsy cavity. Follow-up 2-view mammogram was performed and dictated separately. Using sterile technique and 1% Lidocaine as local anesthetic, under stereotactic guidance, a 9 gauge vacuum assisted device was used to perform core needle biopsy of calcifications in the lower inner right breast using a lateral approach. Specimen radiograph was performed showing calcifications in several core specimens. Specimens with calcifications are identified for pathology. Lesion quadrant: Lower inner right breast At the conclusion of the procedure, a coil shaped tissue marker clip was deployed into the biopsy cavity. Follow-up 2-view mammogram was performed and dictated separately. IMPRESSION: Stereotactic-guided biopsy of calcifications in the lower outer right breast and the lower inner right breast. No apparent complications. Electronically Signed: By: Dorise Bullion III M.D.  On: 01/18/2021 11:58  MM RT BREAST BX W LOC DEV EA AD LESION IMG BX SPEC STEREO GUIDE  Addendum Date: 01/21/2021   ADDENDUM REPORT: 01/21/2021 08:21 ADDENDUM: Pathology revealed INTERMEDIATE GRADE DUCTAL CARCINOMA IN SITU WITH CALCIFICATIONS AND NECROSIS- LOBULAR NEOPLASIA (ATYPICAL LOBULAR HYPERPLASIA) of the RIGHT breast, lower outer,( X clip). This was found to be concordant by Dr. Dorise Bullion. Pathology revealed COMPLEX SCLEROSING LESION WITH USUAL DUCTAL HYPERPLASIA AND CALCIFICATIONS of the RIGHT breast, lower inner, (coil clip). This was found to be concordant by Dr. Dorise Bullion, with excision recommended. Pathology results were discussed with the patient by telephone. The patient reported doing well after the biopsies with tenderness at the sites. Post biopsy instructions and care were reviewed and questions were answered. The patient was encouraged to call [facility] for any additional concerns. Per patient request, surgical consultation has been arranged with Dr. Nedra Hai at [facility] on February 02, 2021. Recommend breast MRI with at least two additional biopsies based on MRI findings or remaining calcifications if breast conservation being considered. Pathology results reported by Stacie Acres RN on 01/20/2021. Electronically Signed   By: Dorise Bullion III M.D.   On: 01/21/2021 08:21   Result Date: 01/21/2021 CLINICAL DATA:  Stereotactic biopsy of right breast calcifications EXAM: RIGHT BREAST STEREOTACTIC CORE NEEDLE BIOPSY COMPARISON:  Previous exams. FINDINGS: The patient and I discussed the procedure of stereotactic-guided biopsy including benefits and alternatives. We discussed the high likelihood of a successful procedure. We discussed the risks of the procedure including infection, bleeding, tissue injury, clip migration, and inadequate sampling. Informed written consent was given. The usual time out protocol was performed immediately  prior to the procedure. Using sterile technique and 1% Lidocaine as local anesthetic, under stereotactic guidance, a 9 gauge vacuum assisted device was used to perform core needle biopsy of calcifications in the lower outer right breast using a lateral approach. Specimen radiograph was performed showing calcifications in several specimens. Specimens with calcifications are identified for pathology. Lesion quadrant: Lower outer right breast At the conclusion of the procedure, an X shaped tissue marker clip was deployed into the biopsy cavity. Follow-up 2-view mammogram was performed and dictated separately. Using  sterile technique and 1% Lidocaine as local anesthetic, under stereotactic guidance, a 9 gauge vacuum assisted device was used to perform core needle biopsy of calcifications in the lower inner right breast using a lateral approach. Specimen radiograph was performed showing calcifications in several core specimens. Specimens with calcifications are identified for pathology. Lesion quadrant: Lower inner right breast At the conclusion of the procedure, a coil shaped tissue marker clip was deployed into the biopsy cavity. Follow-up 2-view mammogram was performed and dictated separately. IMPRESSION: Stereotactic-guided biopsy of calcifications in the lower outer right breast and the lower inner right breast. No apparent complications. Electronically Signed: By: Dorise Bullion III M.D. On: 01/18/2021 11:58      IMPRESSION/PLAN: 1. ER/PR positive, intermediate grade DCIS of the right breast. Dr. Lisbeth Renshaw discusses the pathology findings and reviews the nature of noninvasive breast disease. She will either undergo mastectomy versus breast conservation with lumpectomy. Dr. Lisbeth Renshaw discusses that if she has breast conservation, he would recommend adjuvant external radiotherapy to the breast  to reduce risks of local recurrence followed by antiestrogen therapy. We discussed the risks, benefits, short, and long term  effects of radiotherapy, as well as the curative intent, and the patient is interested in proceeding here in [place] if she has a lumpectomy. Dr. Lisbeth Renshaw discusses the delivery and logistics of radiotherapy and anticipates a course of 4 weeks of radiotherapy. We will see her back a few weeks after surgery to discuss the simulation process and anticipate we starting radiotherapy about 4-6 weeks after surgery.  2. Tobacco cessation. We discussed approaches with tapering consumption. She will keep [place] informed if she needs help with cutting back.   In a visit lasting 60 minutes, greater than 50% of the time was spent face to face reviewing her case, as well as in preparation of, discussing, and coordinating the patient's care.  The above documentation reflects my direct findings during this shared patient visit. Please see the separate note by Dr. Lisbeth Renshaw on this date for the remainder of the patient's plan of care.    Carola Rhine, [facility]    **Disclaimer: This note was dictated with voice recognition software. Similar sounding words can inadvertently be transcribed and this note may contain transcription errors which may not have been corrected upon publication of note.**

## 2021-02-23 NOTE — Progress Notes (Signed)
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  Pathology review: I discussed with the patient the difference between DCIS and invasive breast cancer. DCIS is classified as a Stage 0 breast cancer. It is generally detected through mammograms as calcifications. We discussed the significance of grades and its impact on prognosis. We also discussed the importance of ER and PR receptors and their implications to adjuvant treatment options. Prognosis of DCIS depends on grade and degree of comedo necrosis. It is anticipated that if not treated, 20-30% of DCIS can develop into invasive breast cancer.  Recommendation: 1. Breast conserving surgery versus mastectomy depending on surgical recommendations 2.  If she undergoes lumpectomy, this may have to be followed by adjuvant radiation therapy although this can be eliminated in low risk DCIS and in women over 70 years if they elect to proceed with adjuvant endocrine therapy.  I am not quite sure if this patient can be categorized as low risk DCIS given the extent of DCIS noted 3.  Typically adjuvant endocrine therapy with tamoxifen or aromatase inhibitors is recommended for about 5 years unless she elects to proceed with bilateral mastectomies when there is no role for adjuvant endocrine therapy in DCIS.  Tamoxifen counseling: We discussed the risks and benefits of tamoxifen. These include but not limited to insomnia, hot flashes, mood changes, vaginal dryness, and weight gain. Although rare,  serious side effects including endometrial cancer, risk of blood clots were also discussed. We strongly believe that the benefits far outweigh the risks. Patient understands these risks and consented to starting treatment. Planned treatment duration is 5 years.  Aromatase inhibitors counseling: We have discussed the mechanism of action of aromatase inhibitors today.  We have discussed adverse effects including but not limited to menopausal symptoms, increased risk of osteoporosis and fractures, cardiovascular events, arthralgias and myalgias.  We do believe that the benefits far outweigh the risks.  Plan treatment duration of 5 years.  She appears to have not made her decision yet.  She is really debating to proceed without surgery if at all this is an option.  I have discussed with her that this is not standard of care although there are some trials looking at eliminating surgery and low risk DCIS.  Given the size, I do not believe she quite falls into that category. I encouraged her to proceed with MRI as scheduled tomorrow, follow-up with Dr. Ninfa Linden regarding his surgical recommendations.  If she chooses to not proceed with the surgery and just elects to take tamoxifen or Arimidex, I explained to her that this will not get rid of the ductal carcinoma in situ but may stall progression of DCIS to IDC.  She expressed understanding of the recommendations.  Overall I agree that she has multiple medical comorbidities and some social stressors ongoing, we will recommend referral to social work as well. Thank you for consulting [place] the care of this patient.  Please do not hesitate to contact us with any additional questions or concerns.  HISTORY OF PRESENTING ILLNESS:   Rebecca [name] 75 y.o. female is here because of DCIS  right breast.  This is a very pleasant 75 year old female patient who was diagnosed with ductal carcinoma in situ of the right breast.  She underwent routine screening mammogram and significant  calcifications were seen in her right breast.  She had 2 different biopsies on the right breast.  Right breast needle core biopsy of the lower outer quadrant showed ductal carcinoma in situ with calcifications and necrosis, lobular neoplasia.  Right breast needle core biopsy of the lower inner right breast showed complex sclerosing lesion with usual ductal hyperplasia and calcifications.  Prognostic showed ER, 100%, positive, strong staining intensity PR, 100%, positive, strong staining intensity.  She was seen by Dr. Ninfa Linden and given the extent of disease it was recommended that she undergo breast MRIs if she desires breast conservation.  Given her medical history and COPD, she was also apparently uncertain of how she wants to proceed and whether if she wants to proceed with surgery or not.  She was referred to medical oncology and radiation oncology for further recommendations and MRI was ordered.  Today, she is here by herself. She says she has bad hip which causes pain, this is chronic, basically arthritis related pain She has a nebulizer for her bronchitis/COPD but she doesn't use it. She smokes 1 1/2 PPD per day. She has been having constipation, has been straining to have a bowel movement, notices some blood after straining. She had a colonoscopy some time ago, may be about 3/4 yrs ago. Dad's brother had colon cancer in 53's. Her DM is uncontrolled, last Hb A 1c was 7.1 approx. HTN is moderately controlled. She is very worried about having to go through surgery. She is scheduled to do MRI tomorrow. She will meet Dr Ninfa Linden again and will make her decision.  She had previous breast biopsy of left breast many yrs ago Rest of the pertinent 10 point ROS reviewed and negative.  MEDICAL HISTORY:  Past Medical History:  Diagnosis Date   Breast cancer ([place]) 01/18/2021   COPD (chronic obstructive pulmonary disease) ([facility])    Depression    GERD (gastroesophageal reflux disease)     Hyperlipemia    Hypertension    Osteoporosis    Sleep apnea    has not taken test,snores, may have it    SURGICAL HISTORY: Past Surgical History:  Procedure Laterality Date   ABDOMINAL HYSTERECTOMY     BREAST SURGERY     lt lumpectomy-not cancer   CARPAL TUNNEL RELEASE  10/26/2011   Procedure: CARPAL TUNNEL RELEASE;  Surgeon: Wynonia Sours, MD;  Location: [facility];  Service: Orthopedics;  Laterality: Left;   CARPECTOMY  10/26/2011   Procedure: CARPECTOMY;  Surgeon: Wynonia Sours, MD;  Location: [facility];  Service: Orthopedics;  Laterality: Left;  proximal row carpectomy left wrist    CATARACT EXTRACTION W/PHACO Left 04/13/2018   Procedure: CATARACT EXTRACTION PHACO AND INTRAOCULAR LENS PLACEMENT ([facility]);  Surgeon: Baruch Goldmann, MD;  Location: [facility];  Service: Ophthalmology;  Laterality: Left;  CDE: 14.92   CATARACT EXTRACTION W/PHACO Right 05/22/2018   Procedure: CATARACT EXTRACTION PHACO AND INTRAOCULAR LENS PLACEMENT RIGHT EYE (CDE: 16.73);  Surgeon: Baruch Goldmann, MD;  Location: [facility];  Service: Ophthalmology;  Laterality: Right;   COLONOSCOPY     KNEE ARTHROSCOPY     right   TONSILLECTOMY  x2    SOCIAL HISTORY: Social History   Socioeconomic History   Marital status: Divorced    Spouse name: Not on file   Number  of children: Not on file   Years of education: Not on file   Highest education level: Not on file  Occupational History   Not on file  Tobacco Use   Smoking status: Every Day    Packs/day: 1.50    Types: Cigarettes   Smokeless tobacco: Never  Vaping Use   Vaping Use: Never used  Substance and Sexual Activity   Alcohol use: No   Drug use: Never   Sexual activity: Not on file  Other Topics Concern   Not on file  Social History Narrative   Not on file   Social Determinants of Health   Financial Resource Strain: Not on file  Food Insecurity: Not on file  Transportation Needs: Not on file  Physical Activity: Not on file   Stress: Not on file  Social Connections: Not on file  Intimate Partner Violence: Not At Risk   Fear of Current or Ex-Partner: No   Emotionally Abused: No   Physically Abused: No   Sexually Abused: No    FAMILY HISTORY: Family History  Problem Relation Age of Onset   Prostate cancer Father    Breast cancer Maternal Aunt    Breast cancer Cousin     ALLERGIES:  has No Known Allergies.  MEDICATIONS:  Current Outpatient Medications  Medication Sig Dispense Refill   aspirin 81 MG chewable tablet Chew 81 mg by mouth daily.     budesonide-formoterol (SYMBICORT) 80-4.5 MCG/ACT inhaler Inhale 2 puffs into the lungs 2 (two) times daily. (Patient not taking: Reported on 02/17/2021)     buPROPion (WELLBUTRIN SR) 100 MG 12 hr tablet Take 100 mg by mouth 2 (two) times daily.     cetirizine (ZYRTEC) 10 MG tablet Take 10 mg by mouth daily.     cholecalciferol (VITAMIN D3) 25 MCG (1000 UT) tablet Take 1,000 Units by mouth daily.     docusate sodium (COLACE) 100 MG capsule Take 100 mg by mouth 2 (two) times daily.     hydrochlorothiazide (HYDRODIURIL) 25 MG tablet Take 25 mg by mouth daily.     ipratropium-albuterol (DUONEB) 0.5-2.5 (3) MG/3ML SOLN Take 3 mLs by nebulization every 6 (six) hours as needed.     losartan (COZAAR) 25 MG tablet Take 25 mg by mouth daily.     metFORMIN (GLUCOPHAGE) 500 MG tablet Take 1,000 mg by mouth 2 (two) times daily with a meal.     metoprolol succinate (TOPROL-XL) 50 MG 24 hr tablet Take 50 mg by mouth daily. Take with or immediately following a meal.     Multiple Vitamin (MULTIVITAMIN WITH MINERALS) TABS tablet Take 1 tablet by mouth daily.     Omega-3 1000 MG CAPS Take 1,000 mg by mouth daily.     Plecanatide 3 MG TABS Take 3 mg by mouth daily as needed (IBS).     Polyethyl Glycol-Propyl Glycol (SYSTANE OP) Place 1 drop into both eyes daily as needed (dry eyes).     Semaglutide,0.25 or 0.5MG /DOS, 2 MG/1.5ML SOPN Inject 0.25 mg into the skin every 7 (seven)  days.     simvastatin (ZOCOR) 10 MG tablet Take 10 mg by mouth daily.     No current facility-administered medications for this visit.    PHYSICAL EXAMINATION: ECOG PERFORMANCE STATUS: 0 - Asymptomatic  Vitals:   02/24/21 1010  BP: (!) 151/65  Pulse: (!) 121  Resp: 18  Temp: (!) 97.3 F (36.3 C)  SpO2: 95%   GENERAL:alert, no distress and comfortable, obese NECK: supple, thyroid  normal size, non-tender, without nodularity Breast: No palpable breast masses bilaterally.  Large pendulous breasts.  No regional adenopathy noted. LYMPH:  no palpable lymphadenopathy in the cervical, axillary  LUNGS: clear to auscultation and percussion with normal breathing effort HEART: regular rate & rhythm. ESM noted. Musculoskeletal: BLE edema 1+,chronic according to patient.  PSYCH: alert & oriented x 3 with fluent speech NEURO: no focal motor/sensory deficits  LABORATORY DATA:  I have reviewed the data as listed Lab Results  Component Value Date   WBC 10.2 04/06/2018   HGB 12.2 04/06/2018   HCT 38.3 04/06/2018   MCV 88.2 04/06/2018   PLT 333 04/06/2018     Chemistry      Component Value Date/Time   NA 137 04/06/2018 1130   K 3.8 04/06/2018 1130   CL 101 04/06/2018 1130   CO2 27 04/06/2018 1130   BUN 20 04/06/2018 1130   CREATININE 0.85 04/06/2018 1130      Component Value Date/Time   CALCIUM 9.0 04/06/2018 1130     Have reviewed pertinent imaging and pathology reports  Pathology  Diagnosis 1. Breast, right, needle core biopsy, Lower outer right breast - DUCTAL CARCINOMA IN SITU WITH CALCIFICATIONS AND NECROSIS - LOBULAR NEOPLASIA (ATYPICAL LOBULAR HYPERPLASIA) - SEE COMMENT 2. Breast, right, needle core biopsy, Lower inner right breast - COMPLEX SCLEROSING LESION WITH USUAL DUCTAL HYPERPLASIA AND CALCIFICATIONS - SEE COMMENT  IMMUNOHISTOCHEMICAL AND MORPHOMETRIC ANALYSIS PERFORMED MANUALLY Estrogen Receptor: 100%, POSITIVE, STRONG STAINING INTENSITY Progesterone  Receptor: 100%, POSITIVE, STRONG STAINING INTENSITY  RADIOGRAPHIC STUDIES: I have personally reviewed the radiological images as listed and agreed with the findings in the report. No results found.  All questions were answered. The patient knows to call the clinic with any problems, questions or concerns. I spent 60 minutes in the care of this patient including H and P, review of records, counseling and coordination of care.     Benay Pike, MD 02/23/2021 2:17 PM

## 2021-02-24 ENCOUNTER — Encounter: Payer: Self-pay | Admitting: Hematology and Oncology

## 2021-02-24 ENCOUNTER — Inpatient Hospital Stay: Payer: 59 | Attending: Hematology and Oncology | Admitting: Hematology and Oncology

## 2021-02-24 ENCOUNTER — Other Ambulatory Visit: Payer: Self-pay

## 2021-02-24 VITALS — BP 151/65 | HR 121 | Temp 97.3°F | Resp 18 | Wt 223.1 lb

## 2021-02-24 DIAGNOSIS — D0511 Intraductal carcinoma in situ of right breast: Secondary | ICD-10-CM | POA: Diagnosis present

## 2021-02-24 DIAGNOSIS — F17210 Nicotine dependence, cigarettes, uncomplicated: Secondary | ICD-10-CM

## 2021-02-24 DIAGNOSIS — Z803 Family history of malignant neoplasm of breast: Secondary | ICD-10-CM | POA: Diagnosis not present

## 2021-02-24 DIAGNOSIS — Z8042 Family history of malignant neoplasm of prostate: Secondary | ICD-10-CM

## 2021-02-24 DIAGNOSIS — I10 Essential (primary) hypertension: Secondary | ICD-10-CM

## 2021-02-24 DIAGNOSIS — Z90710 Acquired absence of both cervix and uterus: Secondary | ICD-10-CM | POA: Diagnosis not present

## 2021-02-24 DIAGNOSIS — Z170 Estrogen receptor positive status [ER+]: Secondary | ICD-10-CM | POA: Diagnosis not present

## 2021-02-25 ENCOUNTER — Encounter: Payer: Self-pay | Admitting: Licensed Clinical Social Worker

## 2021-02-25 ENCOUNTER — Telehealth: Payer: Self-pay | Admitting: Licensed Clinical Social Worker

## 2021-02-25 ENCOUNTER — Ambulatory Visit
Admission: RE | Admit: 2021-02-25 | Discharge: 2021-02-25 | Disposition: A | Discharge status: Home / Self Care | Payer: 59 | Source: Ambulatory Visit | Attending: Surgery | Admitting: Surgery

## 2021-02-25 DIAGNOSIS — D0511 Intraductal carcinoma in situ of right breast: Secondary | ICD-10-CM

## 2021-02-25 IMAGING — MR MR BREAST BILAT WO/W CM
6 of 10 series · 26 of 48 positions shown · IV contrast (10 ml Gadavist)
Comparison: Previous exams.
COMPARISON: Previous exams.

Addendum:
CLINICAL DATA: 75-year-old female with recent stereotactic guided
biopsy of calcifications in the lower outer right breast (X shaped
biopsy marking clip) with pathology revealing intermediate grade
ductal carcinoma in situ with calcifications and necrosis as well as
atypical lobular hyperplasia. An additional site of calcifications
biopsied in the lower inner right breast (coil shaped biopsy marking
clip) revealed a complex sclerosing lesion with usual ductal
hyperplasia.
TECHNIQUE: Multiplanar, multisequence MR images of both breasts were obtained
prior to and following the intravenous administration of 10 ml of
Gadavist

[Series 3: t2_tirm_tra ipat (a-p) · axial · 3.0mm · 0.73mm/px · 1 of 57 slices shown]
[im 1/57]
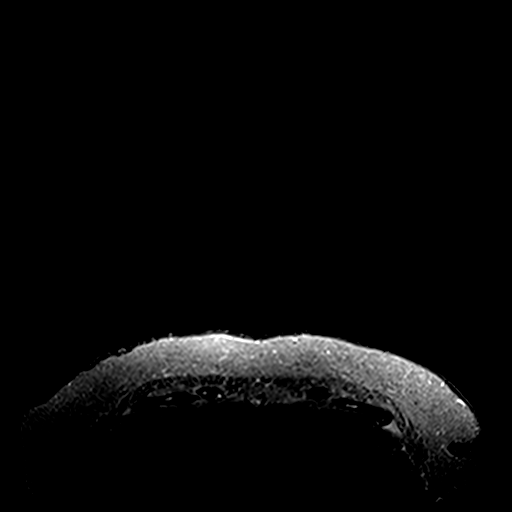

[Series 4: fl3d pre-cm no · axial · non-contrast · 1.2mm · 0.96mm/px · z∈[-89,+102]mm · 5 of 160 slices shown]
[im 1/160]
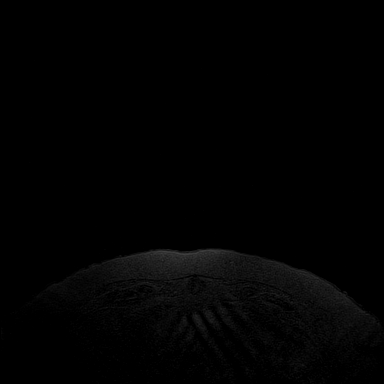
[im 40/160]
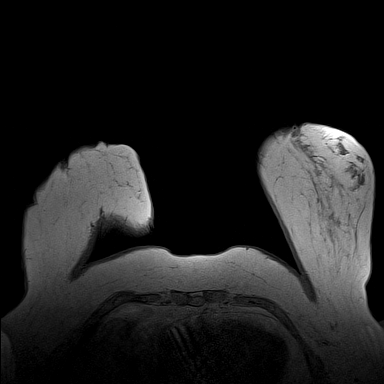
[im 80/160]
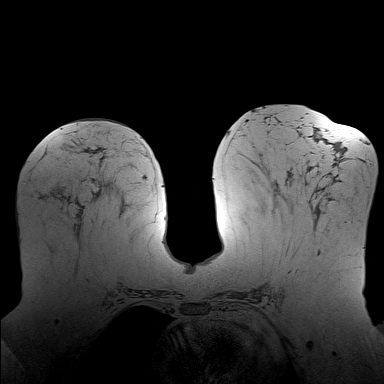
[im 120/160]
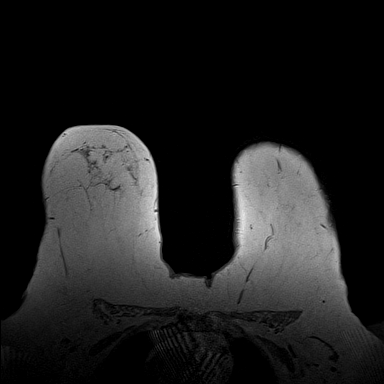
[im 160/160]
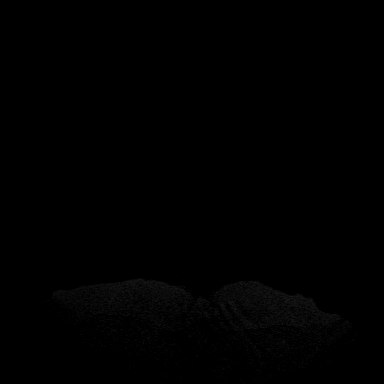

[Series 5: fl3d pre-cm · axial · non-contrast · 1.2mm · 0.96mm/px · z∈[-80,+92]mm · 5 of 144 slices shown]
[im 1/144]
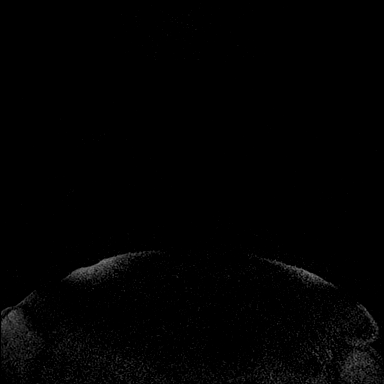
[im 36/144]
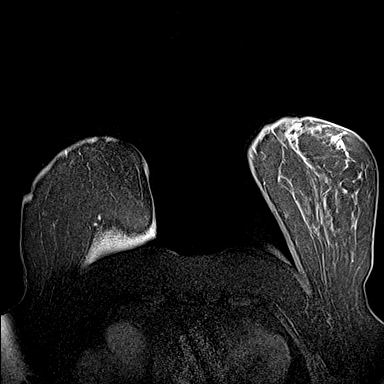
[im 72/144]
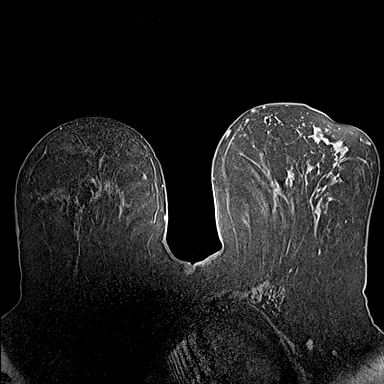
[im 108/144]
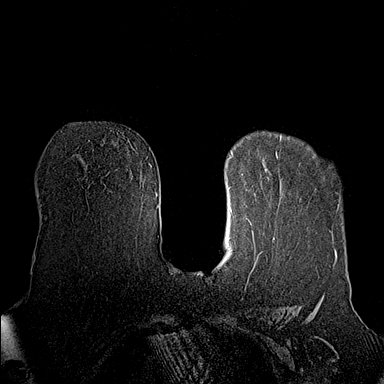
[im 144/144]
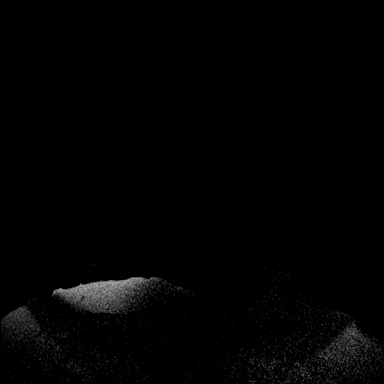

[Series 6: fl3d post-cm 20 · axial · 1.2mm · 0.96mm/px · z∈[-80,+92]mm · 6 of 144 slices shown (1 of 2)]
[im 1/144]
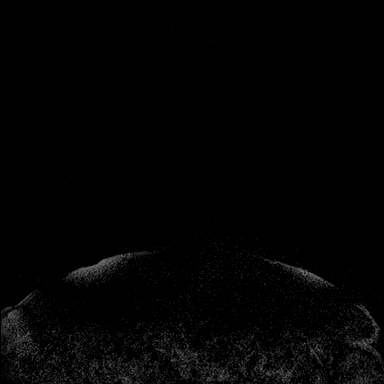
[im 29/144]
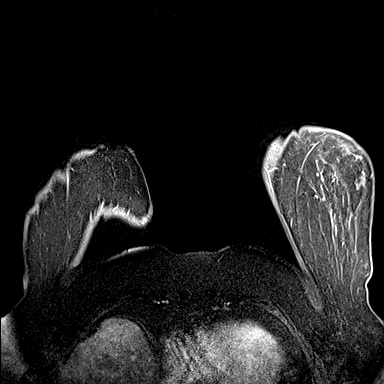
[im 58/144]
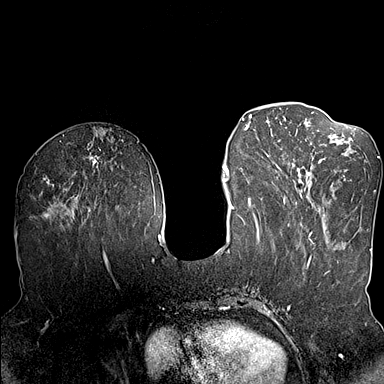
[im 86/144]
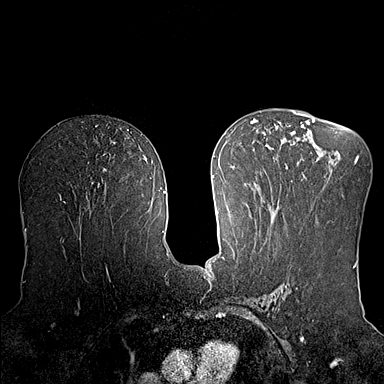
[im 115/144]
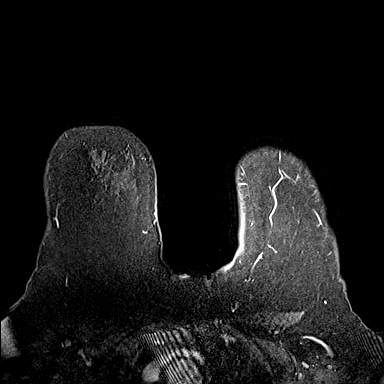
[im 144/144]
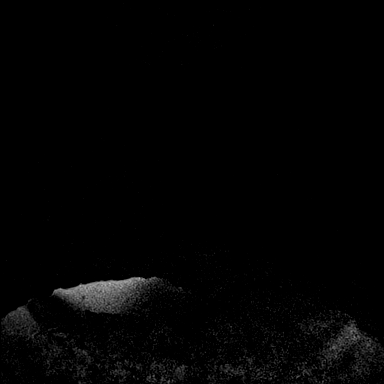

[Series 7: fl3d post-cm 20 · axial · 1.2mm · 0.96mm/px · z∈[-80,+92]mm · 6 of 144 slices shown (2 of 2)]
[im 1/144]
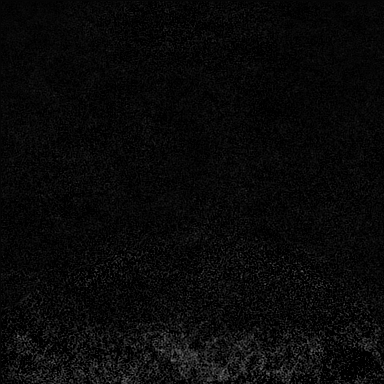
[im 29/144]
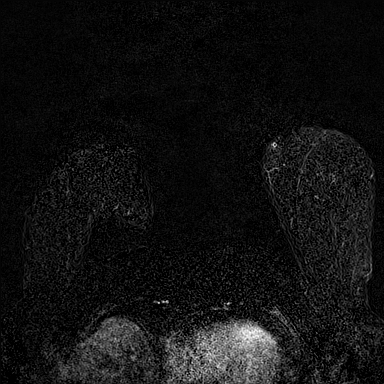
[im 58/144]
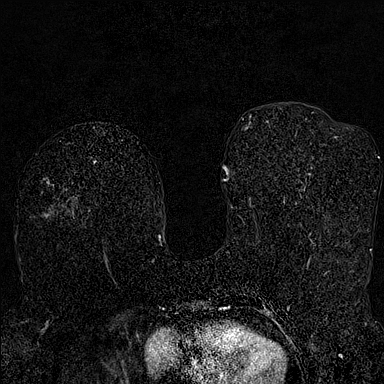
[im 86/144]
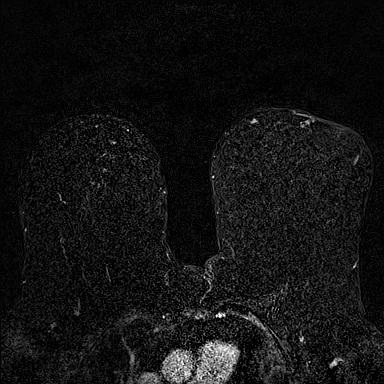
[im 115/144]
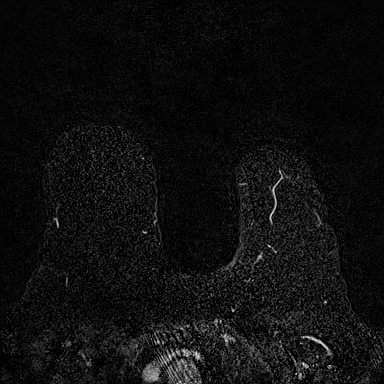
[im 144/144]
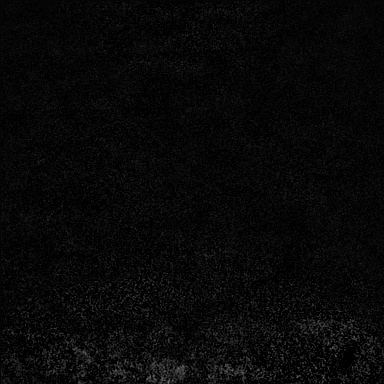

[Series 9: fl3d post-cm 3 · axial · 1.2mm · 0.96mm/px · z∈[-80,-11]mm · 3 of 144 slices shown]
[im 1/144]
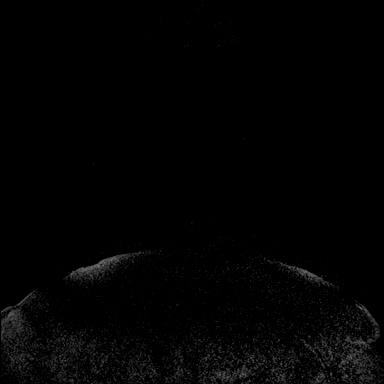
[im 29/144]
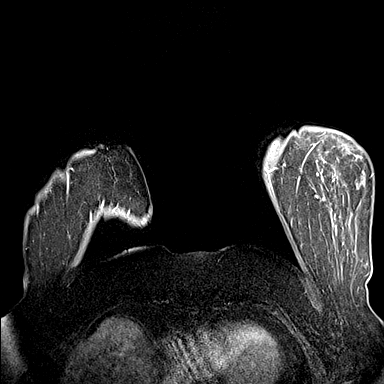
[im 58/144]
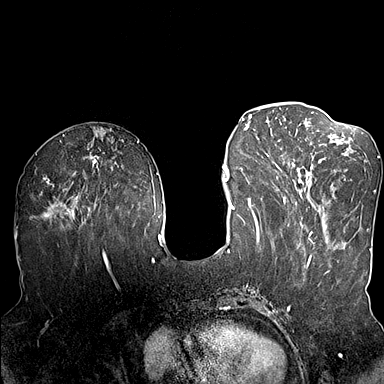

[26 of 48 positions shown; findings below may reference images not displayed]

The X shaped biopsy marking clip at the site of the biopsied
calcifications in the lower outer right breast was noted to be
displaced at least 7.5 cm medial to the biopsy site.

At least 2 additional biopsies were recommended given extensive
calcifications seen in the right breast. Per the initial diagnostic
report dated [DATE] the calcifications in the lower outer
quadrant were noted to span approximately 11 cm.

EXAM:
BILATERAL BREAST MRI WITH AND WITHOUT CONTRAST
Three-dimensional MR images were rendered by post-processing of the
original MR data on an independent workstation. The
three-dimensional MR images were interpreted, and findings are
reported in the following complete MRI report for this study. Three
dimensional images were evaluated at the independent interpreting
workstation using the DynaCAD thin client.
FINDINGS: Breast composition: b.  Scattered fibroglandular tissue.

Background parenchymal enhancement: Mild.

Right breast: Biopsy related changes are present in the lower
outer/central right breast mid depth (subtraction image 88) at site
of biopsy-proven DCIS/ALH (x clip displaced medial to the biopsied
calcifications) as well as within the lower inner inner right breast
anterior depth at site of biopsy proven complex sclerosing lesion
(coil clip). Small scattered areas of nodular enhancement in the
outer right breast are felt to be related to recent biopsy with no
definite suspicious enhancing masses or other areas of abnormal
enhancement.

Left breast: No mass or abnormal enhancement.

Lymph nodes: There is a 0.9 cm enhancing mass with margin
irregularity in the lower outer left breast (subtraction image 105)
possibly but not definitely an intramammary lymph node.

Ancillary findings:  None.
IMPRESSION: 1. Biopsy sites in the lower outer/central right breast and lower
inner/central right breast with surrounding biopsy related change
however no definite additional suspicious areas of enhancement
identified.

2. Indeterminate 0.9 cm enhancing mass in the lower outer left
breast possibly but not definitely an intramammary lymph node.

RECOMMENDATION:
1. Recommend stereotactic guided biopsies of 2 additional sites of
calcifications in the outer right breast, with recommendation to
biopsy the calcifications in the lateral retroareolar right breast
as well as calcifications in the lower outer right breast mid depth.

2. Recommend second-look ultrasound of the lower outer left breast
to evaluate for an intramammary lymph node or other sonographic
correlate for the enhancing mass seen in the lower outer left
breast. If this mass cannot be found/characterized sonographically
then recommend proceeding with MRI guided biopsy.

BI-RADS CATEGORY  4: Suspicious.

ADDENDUM:
An addendum is made due to a typographical error which should read
as:

LEFT BREAST: There is a 0.9 cm enhancing mass with margin
irregularity in the lower outer left breast (subtraction image 105)
possibly but not definitely an intramammary lymph node.

LYMPH NODES: No morphologically abnormal axillary lymph nodes.

*** End of Addendum ***
The X shaped biopsy marking clip at the site of the biopsied
calcifications in the lower outer right breast was noted to be
displaced at least 7.5 cm medial to the biopsy site.

At least 2 additional biopsies were recommended given extensive
calcifications seen in the right breast. Per the initial diagnostic
report dated [DATE] the calcifications in the lower outer
quadrant were noted to span approximately 11 cm.

EXAM:
BILATERAL BREAST MRI WITH AND WITHOUT CONTRAST
Three-dimensional MR images were rendered by post-processing of the
original MR data on an independent workstation. The
three-dimensional MR images were interpreted, and findings are
reported in the following complete MRI report for this study. Three
dimensional images were evaluated at the independent interpreting
workstation using the DynaCAD thin client.
FINDINGS: Breast composition: b.  Scattered fibroglandular tissue.

Background parenchymal enhancement: Mild.

Right breast: Biopsy related changes are present in the lower
outer/central right breast mid depth (subtraction image 88) at site
of biopsy-proven DCIS/ALH (x clip displaced medial to the biopsied
calcifications) as well as within the lower inner inner right breast
anterior depth at site of biopsy proven complex sclerosing lesion
(coil clip). Small scattered areas of nodular enhancement in the
outer right breast are felt to be related to recent biopsy with no
definite suspicious enhancing masses or other areas of abnormal
enhancement.

Left breast: No mass or abnormal enhancement.

Lymph nodes: There is a 0.9 cm enhancing mass with margin
irregularity in the lower outer left breast (subtraction image 105)
possibly but not definitely an intramammary lymph node.

Ancillary findings:  None.
IMPRESSION: 1. Biopsy sites in the lower outer/central right breast and lower
inner/central right breast with surrounding biopsy related change
however no definite additional suspicious areas of enhancement
identified.

2. Indeterminate 0.9 cm enhancing mass in the lower outer left
breast possibly but not definitely an intramammary lymph node.

RECOMMENDATION:
1. Recommend stereotactic guided biopsies of 2 additional sites of
calcifications in the outer right breast, with recommendation to
biopsy the calcifications in the lateral retroareolar right breast
as well as calcifications in the lower outer right breast mid depth.

2. Recommend second-look ultrasound of the lower outer left breast
to evaluate for an intramammary lymph node or other sonographic
correlate for the enhancing mass seen in the lower outer left
breast. If this mass cannot be found/characterized sonographically
then recommend proceeding with MRI guided biopsy.

BI-RADS CATEGORY  4: Suspicious.

## 2021-02-25 MED ORDER — GADOBUTROL 1 MMOL/ML IV SOLN
10.0000 mL | Freq: Once | INTRAVENOUS | Status: AC | PRN
  Administered 2021-02-25: 13:00:00 10 mL via INTRAVENOUS

## 2021-02-25 NOTE — Progress Notes (Signed)
[facility] Clinical Social Work  [employer] representative contacted patient by phone  to offer support and assess for needs.  Per patient, she is not sure what she is going to do yet with treatment since she also has many other health issues and is reluctant to "go under the knife" again because of that.  In the event that she does do treatment, she has family (sister, brother, cousin) who are able to help with rides and support. She is also aware of the transportation benefit through her insurance, but does not like to use it since it is a different person every time. She denied other needs at this time but did write down this CSW's contact information and will reach out if any needs arise if she does pursue treatment.    [place], Benjamin Perez Worker [employer]

## 2021-02-25 NOTE — Telephone Encounter (Signed)
Rebecca [name]  Clinical Social [name] was referred by Dr. Chryl Heck for assessment of psychosocial needs.  Clinical Social Worker  attempted to contact patient by phone   to offer support and assess for needs.    No answer. VM full, unable to leave message. Will try again at a future time.    [place], [place] Worker [employer]

## 2021-03-03 ENCOUNTER — Encounter: Payer: Self-pay | Admitting: *Deleted

## 2021-03-04 ENCOUNTER — Encounter: Payer: Self-pay | Admitting: *Deleted

## 2021-03-10 ENCOUNTER — Telehealth: Payer: Self-pay | Admitting: Hematology and Oncology

## 2021-03-10 NOTE — Telephone Encounter (Signed)
Scheduled appointment per provider. Patient aware.  

## 2021-03-11 ENCOUNTER — Encounter: Payer: Self-pay | Admitting: *Deleted

## 2021-03-18 ENCOUNTER — Other Ambulatory Visit: Payer: Self-pay

## 2021-03-18 ENCOUNTER — Encounter: Payer: Self-pay | Admitting: *Deleted

## 2021-03-18 ENCOUNTER — Encounter: Payer: Self-pay | Admitting: Hematology and Oncology

## 2021-03-18 ENCOUNTER — Inpatient Hospital Stay: Payer: 59 | Attending: Hematology and Oncology | Admitting: Hematology and Oncology

## 2021-03-18 VITALS — BP 167/59 | HR 77 | Temp 97.9°F | Resp 18 | Ht 62.0 in | Wt 220.9 lb

## 2021-03-18 DIAGNOSIS — D0511 Intraductal carcinoma in situ of right breast: Secondary | ICD-10-CM

## 2021-03-18 DIAGNOSIS — Z170 Estrogen receptor positive status [ER+]: Secondary | ICD-10-CM | POA: Insufficient documentation

## 2021-03-18 DIAGNOSIS — Z90710 Acquired absence of both cervix and uterus: Secondary | ICD-10-CM | POA: Insufficient documentation

## 2021-03-18 DIAGNOSIS — Z8042 Family history of malignant neoplasm of prostate: Secondary | ICD-10-CM | POA: Diagnosis not present

## 2021-03-18 DIAGNOSIS — Z13820 Encounter for screening for osteoporosis: Secondary | ICD-10-CM | POA: Diagnosis not present

## 2021-03-18 DIAGNOSIS — I10 Essential (primary) hypertension: Secondary | ICD-10-CM | POA: Insufficient documentation

## 2021-03-18 DIAGNOSIS — J449 Chronic obstructive pulmonary disease, unspecified: Secondary | ICD-10-CM | POA: Insufficient documentation

## 2021-03-18 DIAGNOSIS — F17210 Nicotine dependence, cigarettes, uncomplicated: Secondary | ICD-10-CM | POA: Diagnosis not present

## 2021-03-18 DIAGNOSIS — E1165 Type 2 diabetes mellitus with hyperglycemia: Secondary | ICD-10-CM | POA: Insufficient documentation

## 2021-03-18 DIAGNOSIS — Z803 Family history of malignant neoplasm of breast: Secondary | ICD-10-CM | POA: Insufficient documentation

## 2021-03-18 MED ORDER — ANASTROZOLE 1 MG PO TABS: 1.0000 mg | ORAL_TABLET | Freq: Every day | ORAL | 2 refills | Status: DC

## 2021-03-18 NOTE — Progress Notes (Signed)
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  During her initial visit to discuss about management recommendations of DCIS, we have discussed about breast conserving surgery followed by radiation plus endocrine therapy versus mastectomy followed by adjuvant endocrine therapy.  She was very reluctant to proceed with surgery and was going to discussed with Dr. Ninfa Linden regarding the recommendations after having an MRI.    She had an MRI since last visit which showed biopsy site and an indeterminate 0.9 cm enhancing mass in the lower outer left breast and also to consider stereotactic guided biopsies of 2 additional sites of calcifications in the outer right breast.  We have got a message that she does not want to proceed with surgery and just want to try endocrine therapy.  Hence she is here for follow-up.  We have previously discussed about tamoxifen and aromatase inhibitors as mentioned below.  Given her heavy smoking history and sedentary lifestyle, she might be a high risk for blood clots.  Although she has a baseline history of osteopenia, will start with aromatase inhibitors, get a baseline bone density scan done.  We have discussed the following details about aromatase inhibitors once again.  Aromatase inhibitors counseling: We have discussed the mechanism of action of aromatase inhibitors today.  We have discussed adverse effects  including but not limited to menopausal symptoms, increased risk of osteoporosis and fractures, cardiovascular events, arthralgias and myalgias.  We do believe that the benefits far outweigh the risks.  Plan treatment duration of 5 years.  She understands that if she chooses to not proceed with the surgery and just elects to take tamoxifen or Arimidex, I explained to her that this will not get rid of the ductal carcinoma in situ but may stall progression of DCIS.  She will return to clinic in 3 months for toxicity check and we can plan to repeat imaging of the breast in 6 months.   HISTORY OF PRESENTING ILLNESS:   Rebecca [name] 76 y.o. female is here because of DCIS right breast.  This is a very pleasant 76 year old female patient who was diagnosed with ductal carcinoma in situ of the right breast.  She underwent routine screening mammogram and significant calcifications were seen in her right breast.  She had 2 different biopsies on the right breast.  Right breast needle core biopsy of the lower outer quadrant showed ductal carcinoma in situ with calcifications and necrosis, lobular neoplasia.  Right breast needle core biopsy of the lower inner right breast showed complex sclerosing lesion with usual ductal hyperplasia and calcifications.  Prognostic showed ER, 100%, positive, strong staining intensity PR, 100%, positive, strong staining intensity.  She was seen by Dr. Ninfa Linden and given the extent of disease it was recommended that she undergo breast MRIs if she desires breast conservation.  Given her medical history and COPD, she was also apparently uncertain of how she wants to proceed and whether if she wants to proceed with surgery or not.  She was referred  to medical oncology and radiation oncology for further recommendations and MRI was ordered.  During her last visit we have discussed about standard of care recommendations for DCIS including lumpectomy followed by radiation and adjuvant  endocrine therapy versus mastectomy followed by adjuvant endocrine therapy.  She was debating to proceed with surgery.  Since last visit she had an MRI of her breast and also made the decision not to proceed with surgery hence she is back to discuss about endocrine therapy with medical oncology.  She says she has bad hip which causes pain, this is chronic, basically arthritis related pain She has a nebulizer for her bronchitis/COPD but she doesn't use it. She smokes 1 1/2 PPD per day. She has been having constipation, has been straining to have a bowel movement, notices some blood after straining. She had a colonoscopy some time ago, may be about 3/4 yrs ago. Dad's brother had colon cancer in 73's. Her DM is uncontrolled, last Hb A 1c was 7.1 approx. HTN is moderately controlled.  Interval history  She is here for a follow-up with her younger son.  Since last visit, she had her MRI and has decided not to proceed with surgery since she is not in the best of her shape and she has several comorbidities.  She was hoping to maintain her quality of life as best as she can and she is worried that surgery might end badly for her.  She continues to deal with arthritis.  No other major changes in her health. She had previous breast biopsy of left breast many yrs ago Rest of the pertinent 10 point ROS reviewed and negative.  MEDICAL HISTORY:  Past Medical History:  Diagnosis Date   Breast cancer ([place]) 01/18/2021   COPD (chronic obstructive pulmonary disease) (HCC)    Depression    GERD (gastroesophageal reflux disease)    Hyperlipemia    Hypertension    Osteoporosis    Sleep apnea    has not taken test,snores, may have it    SURGICAL HISTORY: Past Surgical History:  Procedure Laterality Date   ABDOMINAL HYSTERECTOMY     BREAST SURGERY     lt lumpectomy-not cancer   CARPAL TUNNEL RELEASE  10/26/2011   Procedure: CARPAL TUNNEL RELEASE;  Surgeon: Wynonia Sours, MD;  Location: [place];  Service: Orthopedics;  Laterality: Left;   CARPECTOMY  10/26/2011   Procedure: CARPECTOMY;  Surgeon: Wynonia Sours, MD;  Location: [place];  Service: Orthopedics;  Laterality: Left;  proximal row carpectomy left wrist    CATARACT EXTRACTION W/PHACO Left 04/13/2018   Procedure: CATARACT EXTRACTION PHACO AND INTRAOCULAR LENS PLACEMENT ([place]);  Surgeon: Baruch Goldmann, MD;  Location: [facility];  Service: Ophthalmology;  Laterality: Left;  CDE: 14.92   CATARACT EXTRACTION W/PHACO Right 05/22/2018   Procedure: CATARACT EXTRACTION PHACO AND INTRAOCULAR LENS PLACEMENT RIGHT EYE (CDE: 16.73);  Surgeon: Baruch Goldmann, MD;  Location: [facility];  Service: Ophthalmology;  Laterality: Right;   COLONOSCOPY     KNEE ARTHROSCOPY     right   TONSILLECTOMY  x2    SOCIAL HISTORY: Social History   Socioeconomic History   Marital status: Divorced    Spouse name: Not on file   Number of children: Not on file   Years of education: Not on file   Highest education level: Not on file  Occupational History   Not on file  Tobacco Use   Smoking status: Every Day    Packs/day: 1.50  Types: Cigarettes   Smokeless tobacco: Never  Vaping Use   Vaping Use: Never used  Substance and Sexual Activity   Alcohol use: No   Drug use: Never   Sexual activity: Not on file  Other Topics Concern   Not on file  Social History Narrative   Not on file   Social Determinants of Health   Financial Resource Strain: Not on file  Food Insecurity: Not on file  Transportation Needs: Not on file  Physical Activity: Not on file  Stress: Not on file  Social Connections: Not on file  Intimate Partner Violence: Not At Risk   Fear of Current or Ex-Partner: No   Emotionally Abused: No   Physically Abused: No   Sexually Abused: No    FAMILY HISTORY: Family History  Problem Relation Age of Onset   Prostate cancer Father    Breast cancer Maternal Aunt    Breast cancer Cousin     ALLERGIES:  has No  Known Allergies.  MEDICATIONS:  Current Outpatient Medications  Medication Sig Dispense Refill   anastrozole (ARIMIDEX) 1 MG tablet Take 1 tablet (1 mg total) by mouth daily. 30 tablet 2   albuterol (VENTOLIN HFA) 108 (90 Base) MCG/ACT inhaler Inhale into the lungs.     aspirin 81 MG chewable tablet Chew 81 mg by mouth daily.     budesonide-formoterol (SYMBICORT) 80-4.5 MCG/ACT inhaler Inhale 2 puffs into the lungs 2 (two) times daily. (Patient not taking: Reported on 02/17/2021)     buPROPion (WELLBUTRIN SR) 100 MG 12 hr tablet Take 100 mg by mouth 2 (two) times daily.     cetirizine (ZYRTEC) 10 MG tablet Take 10 mg by mouth daily.     cholecalciferol (VITAMIN D3) 25 MCG (1000 UT) tablet Take 1,000 Units by mouth daily.     docusate sodium (COLACE) 100 MG capsule Take 100 mg by mouth 2 (two) times daily.     hydrochlorothiazide (HYDRODIURIL) 25 MG tablet Take 25 mg by mouth daily.     ipratropium-albuterol (DUONEB) 0.5-2.5 (3) MG/3ML SOLN Take 3 mLs by nebulization every 6 (six) hours as needed. (Patient not taking: Reported on 02/24/2021)     losartan (COZAAR) 25 MG tablet Take 25 mg by mouth daily.     metFORMIN (GLUCOPHAGE) 500 MG tablet Take 1,000 mg by mouth 2 (two) times daily with a meal.     metoprolol succinate (TOPROL-XL) 50 MG 24 hr tablet Take 50 mg by mouth daily. Take with or immediately following a meal.     Multiple Vitamin (MULTIVITAMIN WITH MINERALS) TABS tablet Take 1 tablet by mouth daily.     Omega-3 1000 MG CAPS Take 1,000 mg by mouth daily.     OneTouch Delica Lancets 96E MISC SMARTSIG:1 Unit(s) Topical Daily     ONETOUCH VERIO test strip 1 each daily.     Plecanatide 3 MG TABS Take 3 mg by mouth daily as needed (IBS).     Polyethyl Glycol-Propyl Glycol (SYSTANE OP) Place 1 drop into both eyes daily as needed (dry eyes).     Semaglutide,0.25 or 0.5MG /DOS, 2 MG/1.5ML SOPN Inject 0.25 mg into the skin every 7 (seven) days.     simvastatin (ZOCOR) 10 MG tablet Take 10 mg  by mouth daily.     No current facility-administered medications for this visit.    PHYSICAL EXAMINATION: ECOG PERFORMANCE STATUS: 0 - Asymptomatic  Vitals:   03/18/21 1139  BP: (!) 167/59  Pulse: 77  Resp: 18  Temp: 97.9  F (36.6 C)  SpO2: 97%   Physical exam deferred in lieu of counseling  LABORATORY DATA:  I have reviewed the data as listed Lab Results  Component Value Date   WBC 10.2 04/06/2018   HGB 12.2 04/06/2018   HCT 38.3 04/06/2018   MCV 88.2 04/06/2018   PLT 333 04/06/2018     Chemistry      Component Value Date/Time   NA 137 04/06/2018 1130   K 3.8 04/06/2018 1130   CL 101 04/06/2018 1130   CO2 27 04/06/2018 1130   BUN 20 04/06/2018 1130   CREATININE 0.85 04/06/2018 1130      Component Value Date/Time   CALCIUM 9.0 04/06/2018 1130     Have reviewed pertinent imaging and pathology reports  Pathology  Diagnosis 1. Breast, right, needle core biopsy, Lower outer right breast - DUCTAL CARCINOMA IN SITU WITH CALCIFICATIONS AND NECROSIS - LOBULAR NEOPLASIA (ATYPICAL LOBULAR HYPERPLASIA) - SEE COMMENT 2. Breast, right, needle core biopsy, Lower inner right breast - COMPLEX SCLEROSING LESION WITH USUAL DUCTAL HYPERPLASIA AND CALCIFICATIONS - SEE COMMENT  IMMUNOHISTOCHEMICAL AND MORPHOMETRIC ANALYSIS PERFORMED MANUALLY Estrogen Receptor: 100%, POSITIVE, STRONG STAINING INTENSITY Progesterone Receptor: 100%, POSITIVE, STRONG STAINING INTENSITY  RADIOGRAPHIC STUDIES: I have personally reviewed the radiological images as listed and agreed with the findings in the report. MR BREAST BILATERAL W WO CONTRAST INC CAD  Result Date: 02/25/2021 CLINICAL DATA:  76 year old female with recent stereotactic guided biopsy of calcifications in the lower outer right breast (X shaped biopsy marking clip) with pathology revealing intermediate grade ductal carcinoma in situ with calcifications and necrosis as well as atypical lobular hyperplasia. An additional site of  calcifications biopsied in the lower inner right breast (coil shaped biopsy marking clip) revealed a complex sclerosing lesion with usual ductal hyperplasia. The X shaped biopsy marking clip at the site of the biopsied calcifications in the lower outer right breast was noted to be displaced at least 7.5 cm medial to the biopsy site. At least 2 additional biopsies were recommended given extensive calcifications seen in the right breast. Per the initial diagnostic report dated 01/01/2021 the calcifications in the lower outer quadrant were noted to span approximately 11 cm. EXAM: BILATERAL BREAST MRI WITH AND WITHOUT CONTRAST TECHNIQUE: Multiplanar, multisequence MR images of both breasts were obtained prior to and following the intravenous administration of 10 ml of Gadavist Three-dimensional MR images were rendered by post-processing of the original MR data on an independent workstation. The three-dimensional MR images were interpreted, and findings are reported in the following complete MRI report for this study. Three dimensional images were evaluated at the independent interpreting workstation using the DynaCAD thin client. COMPARISON:  Previous exams. FINDINGS: Breast composition: b.  Scattered fibroglandular tissue. Background parenchymal enhancement: Mild. Right breast: Biopsy related changes are present in the lower outer/central right breast mid depth (subtraction image 88) at site of biopsy-proven DCIS/ALH (x clip displaced medial to the biopsied calcifications) as well as within the lower inner inner right breast anterior depth at site of biopsy proven complex sclerosing lesion (coil clip). Small scattered areas of nodular enhancement in the outer right breast are felt to be related to recent biopsy with no definite suspicious enhancing masses or other areas of abnormal enhancement. Left breast: No mass or abnormal enhancement. Lymph nodes: There is a 0.9 cm enhancing mass with margin irregularity in the  lower outer left breast (subtraction image 105) possibly but not definitely an intramammary lymph node. Ancillary findings:  None. IMPRESSION: 1.  Biopsy sites in the lower outer/central right breast and lower inner/central right breast with surrounding biopsy related change however no definite additional suspicious areas of enhancement identified. 2. Indeterminate 0.9 cm enhancing mass in the lower outer left breast possibly but not definitely an intramammary lymph node. RECOMMENDATION: 1. Recommend stereotactic guided biopsies of 2 additional sites of calcifications in the outer right breast, with recommendation to biopsy the calcifications in the lateral retroareolar right breast as well as calcifications in the lower outer right breast mid depth. 2. Recommend second-look ultrasound of the lower outer left breast to evaluate for an intramammary lymph node or other sonographic correlate for the enhancing mass seen in the lower outer left breast. If this mass cannot be found/characterized sonographically then recommend proceeding with MRI guided biopsy. BI-RADS CATEGORY  4: Suspicious. Electronically Signed   By: Everlean Alstrom M.D.   On: 02/25/2021 15:02   All questions were answered. The patient knows to call the clinic with any problems, questions or concerns. I spent 30 minutes in the care of this patient including H and P, review of records, counseling and coordination of care.     Benay Pike, MD 03/18/2021 1:05 PM

## 2021-03-30 ENCOUNTER — Ambulatory Visit: Payer: 59 | Admitting: Hematology and Oncology

## 2021-04-23 ENCOUNTER — Telehealth: Payer: Self-pay | Admitting: *Deleted

## 2021-04-26 ENCOUNTER — Telehealth: Payer: Self-pay | Admitting: *Deleted

## 2021-05-11 ENCOUNTER — Other Ambulatory Visit: Payer: Self-pay | Admitting: Surgery

## 2021-05-11 DIAGNOSIS — N6320 Unspecified lump in the left breast, unspecified quadrant: Secondary | ICD-10-CM

## 2021-05-11 DIAGNOSIS — D0511 Intraductal carcinoma in situ of right breast: Secondary | ICD-10-CM

## 2021-05-21 ENCOUNTER — Other Ambulatory Visit: Payer: Self-pay | Admitting: Surgery

## 2021-05-21 ENCOUNTER — Ambulatory Visit
Admission: RE | Admit: 2021-05-21 | Discharge: 2021-05-21 | Disposition: A | Discharge status: Home / Self Care | Payer: 59 | Source: Ambulatory Visit | Attending: Surgery | Admitting: Surgery

## 2021-05-21 DIAGNOSIS — N6320 Unspecified lump in the left breast, unspecified quadrant: Secondary | ICD-10-CM

## 2021-05-21 DIAGNOSIS — D0511 Intraductal carcinoma in situ of right breast: Secondary | ICD-10-CM

## 2021-05-21 IMAGING — US US BREAST*L* LIMITED INC AXILLA
1 series · 2 of 2 positions shown · non-contrast
Comparison: Previous exam(s).

CLINICAL DATA: 75-year-old female with biopsy proven ductal
carcinoma in-situ in the lower outer quadrant of the right breast
and complex sclerosing lesion in the lower-inner right breast.
Recent MRI showed an area of abnormal enhancement in the lower outer
quadrant of the left breast. Second-look ultrasound with possible
ultrasound-guided core biopsy recommended. It was also recommended
that 2 additional sites of calcifications in the outer right breast
be biopsied with stereotactic guidance.

EXAM:
ULTRASOUND OF THE LEFT BREAST

[Series 1: us breast*left* limited inc axilla · 0.07mm/px · 2 of 2 slices shown]
[im 1/2]
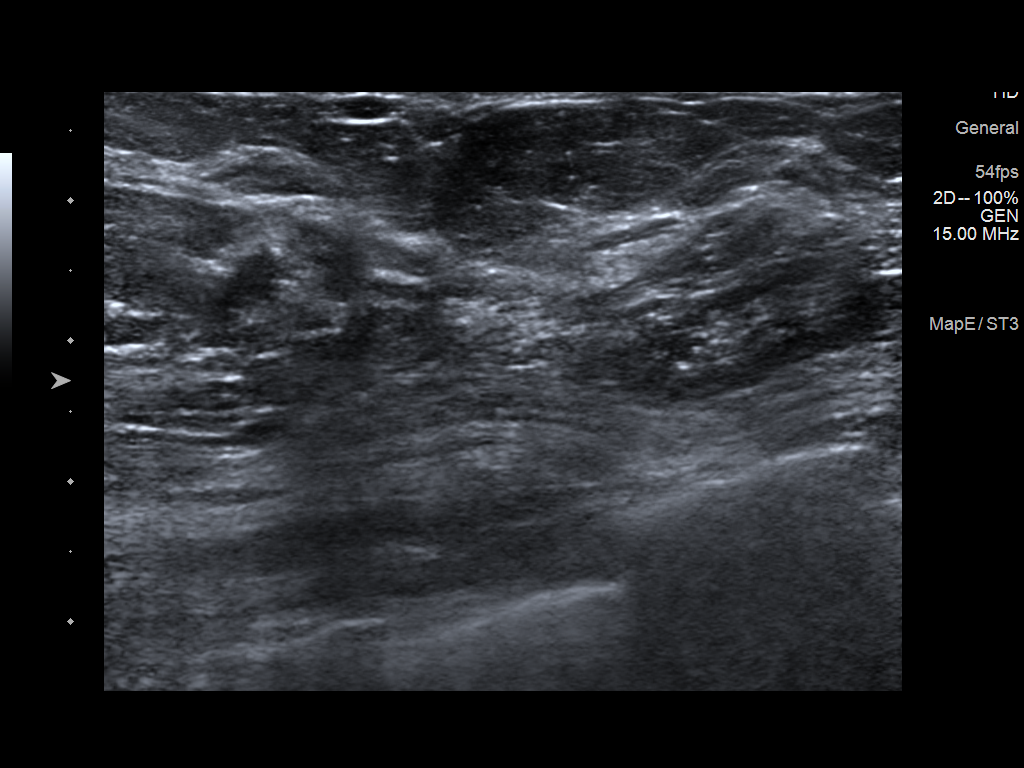
[im 2/2]
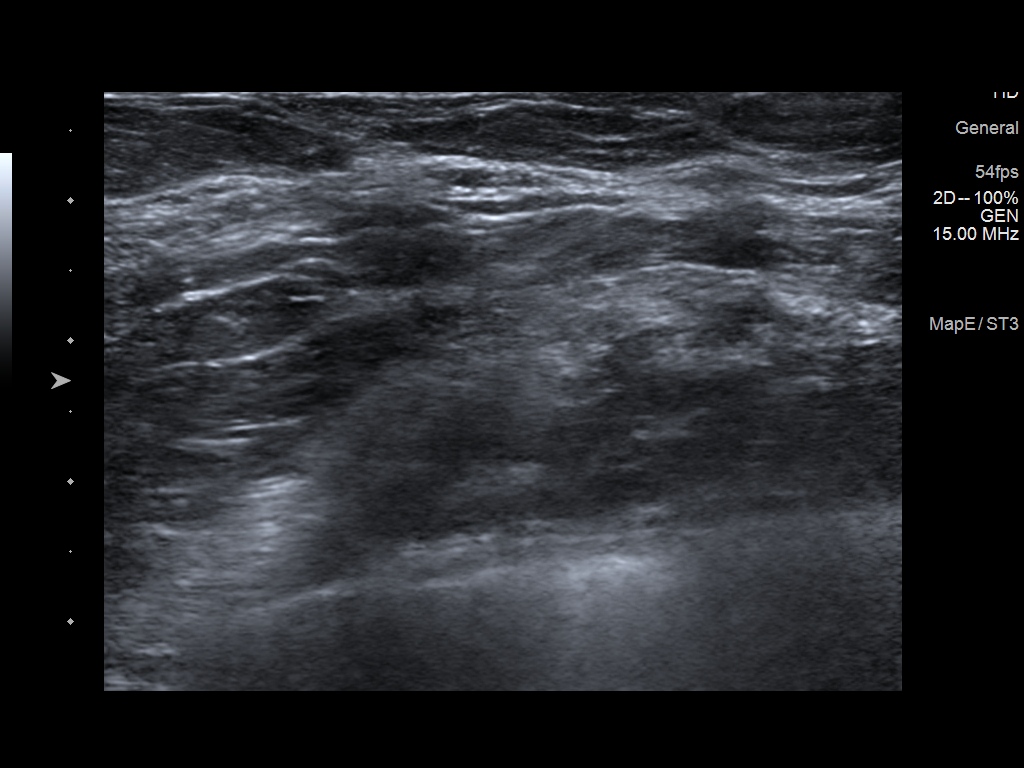

[2 of 2 positions shown; findings below may reference images not displayed]

FINDINGS: On physical exam, I do not palpate a mass in the lower outer
quadrant of the left breast.

Targeted ultrasound is performed, showing normal tissue throughout
the entire lower outer quadrant of the left breast. No solid or
cystic mass, abnormal shadowing or distortion visualized.
IMPRESSION: No sonographic evidence of malignancy in the lower outer quadrant of
the left breast.

RECOMMENDATION:
MR guided core biopsy of the mass in the lower outer quadrant of the
left breast is recommended.

I have discussed the findings and recommendations with the patient.
If applicable, a reminder letter will be sent to the patient
regarding the next appointment.

BI-RADS CATEGORY  4: Suspicious.

## 2021-06-03 ENCOUNTER — Ambulatory Visit
Admission: RE | Admit: 2021-06-03 | Discharge: 2021-06-03 | Disposition: A | Discharge status: Home / Self Care | Payer: 59 | Source: Ambulatory Visit | Attending: Surgery | Admitting: Surgery

## 2021-06-03 DIAGNOSIS — N6320 Unspecified lump in the left breast, unspecified quadrant: Secondary | ICD-10-CM

## 2021-06-03 DIAGNOSIS — D0511 Intraductal carcinoma in situ of right breast: Secondary | ICD-10-CM

## 2021-06-03 IMAGING — MR MR BREAST BX W LOC DEV 1ST LESION IMAGE BX SPEC MR GUIDE*L*
11 of 16 series · 32 of 48 positions shown · IV contrast (10 ml multihance)
Comparison: Previous exams.
COMPARISON: Previous exams.

Addendum:
CLINICAL DATA: Indeterminate 0.9 cm enhancing mass in the lower
outer left breast on a recent staging MRI for recently diagnosed
right breast DCIS, atypical lobular hyperplasia and complex
sclerosing lesion. The 0.9 cm left breast mass was sonographically
occult.

EXAM:
MRI GUIDED CORE NEEDLE BIOPSY OF THE LEFT BREAST
TECHNIQUE: Multiplanar, multisequence MR imaging of the left breast was
performed both before and after administration of intravenous
contrast.
CONTRAST:  10mL GADAVIST GADOBUTROL 1 MMOL/ML IV SOLN

[Series 2: fiducial unilateral · sagittal · 2.0mm · 1.33mm/px · 2 of 52 slices shown]
[im 1/52]
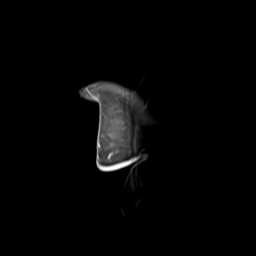
[im 52/52]
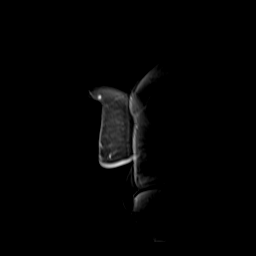

[Series 3: dynamic pre · axial · non-contrast · 1.3mm · 0.73mm/px · z∈[-72,+135]mm · 4 of 160 slices shown]
[im 1/160]
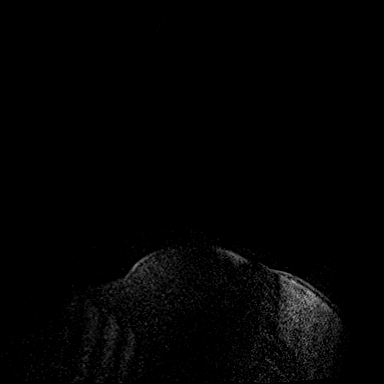
[im 54/160]
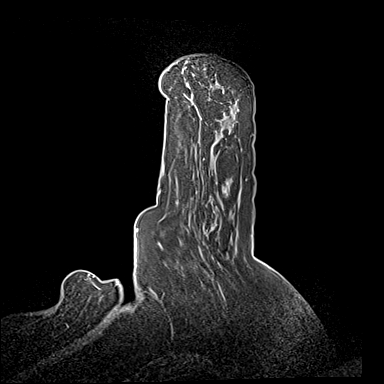
[im 107/160]
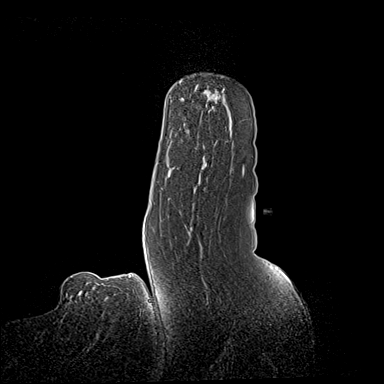
[im 160/160]
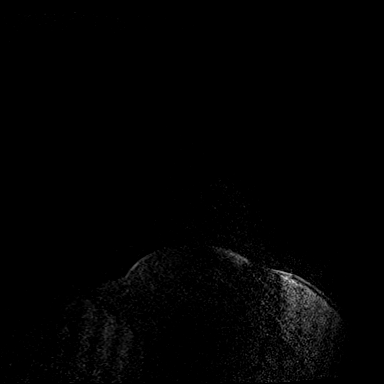

[Series 4: dynamic post 20 · axial · 1.3mm · 0.73mm/px · z∈[-72,+135]mm · 3 of 160 slices shown (1 of 2)]
[im 1/160]
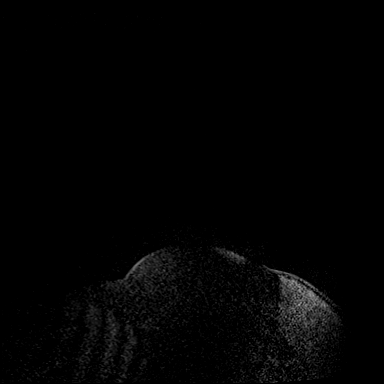
[im 80/160]
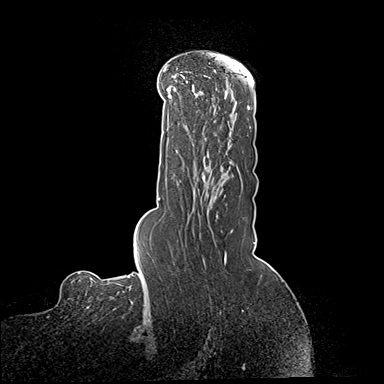
[im 160/160]
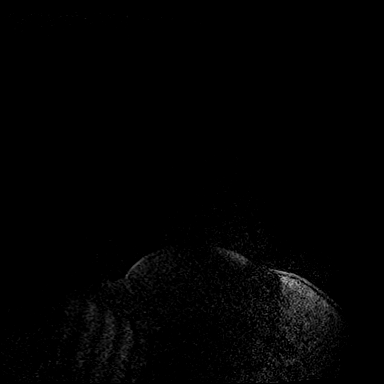

[Series 5: dynamic post 20 · axial · 1.3mm · 0.73mm/px · z∈[-72,+135]mm · 3 of 160 slices shown (2 of 2)]
[im 1/160]
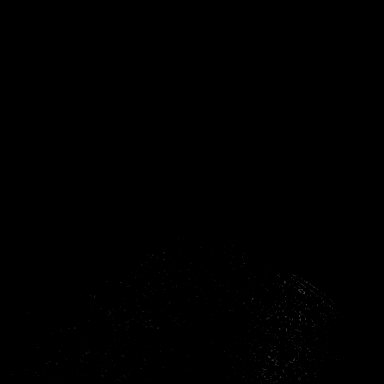
[im 80/160]
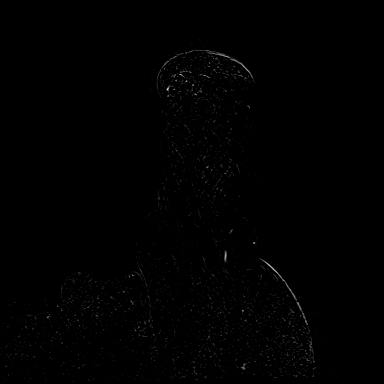
[im 160/160]
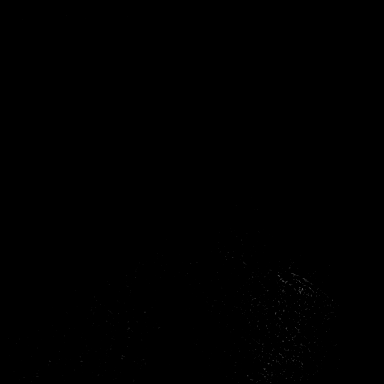

[Series 6: dynamic post 3 · axial · 1.3mm · 0.73mm/px · z∈[-72,+135]mm · 3 of 160 slices shown (1 of 2)]
[im 1/160]
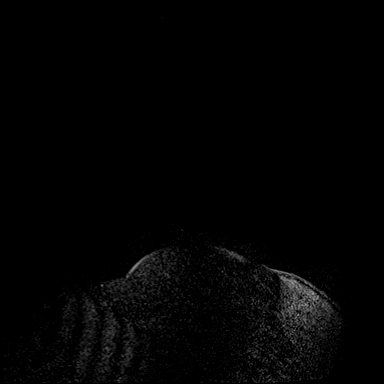
[im 80/160]
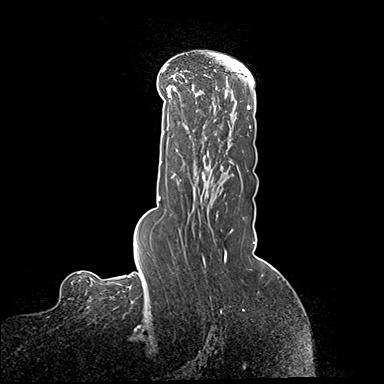
[im 160/160]
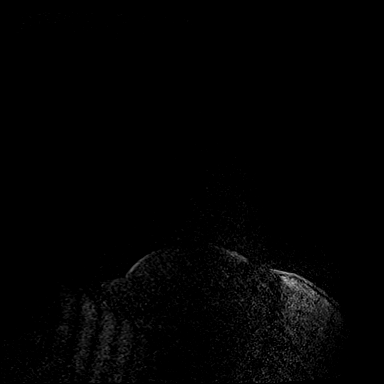

[Series 7: dynamic post 3 · axial · 1.3mm · 0.73mm/px · z∈[-72,+135]mm · 3 of 160 slices shown (2 of 2)]
[im 1/160]
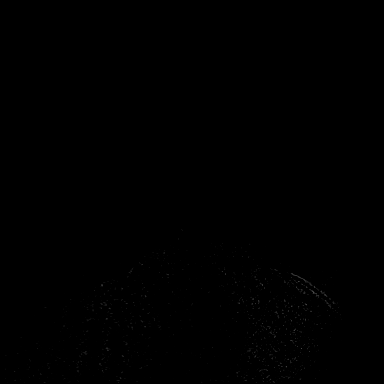
[im 80/160]
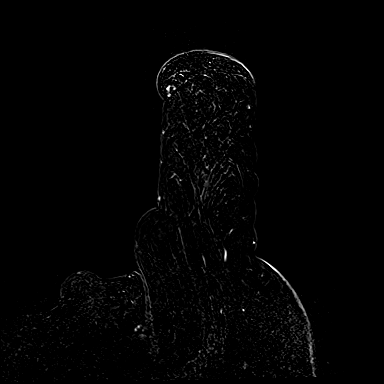
[im 160/160]
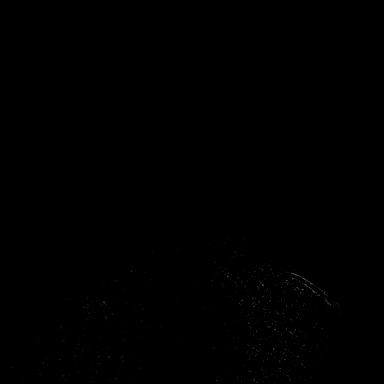

[Series 8: dynamic post 5 · axial · 1.3mm · 0.73mm/px · z∈[-72,+135]mm · 3 of 160 slices shown (1 of 2)]
[im 1/160]
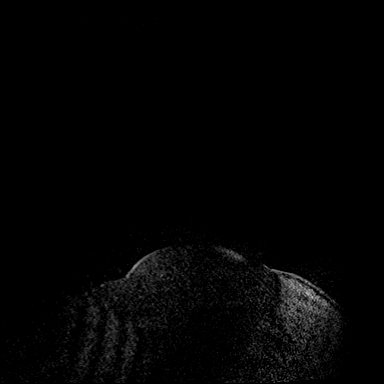
[im 80/160]
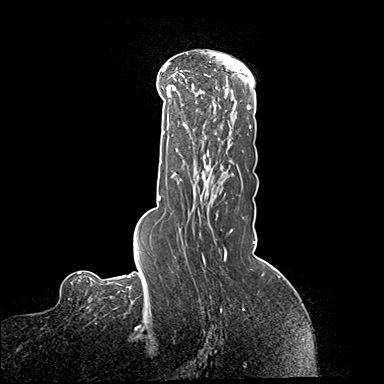
[im 160/160]
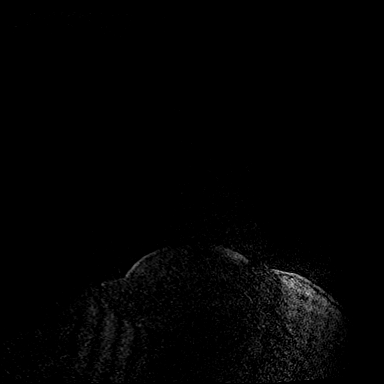

[Series 9: dynamic post 5 · axial · 1.3mm · 0.73mm/px · z∈[-72,+135]mm · 3 of 160 slices shown (2 of 2)]
[im 1/160]
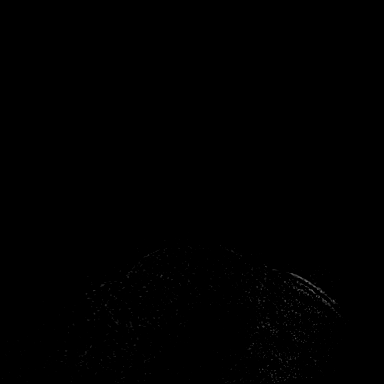
[im 80/160]
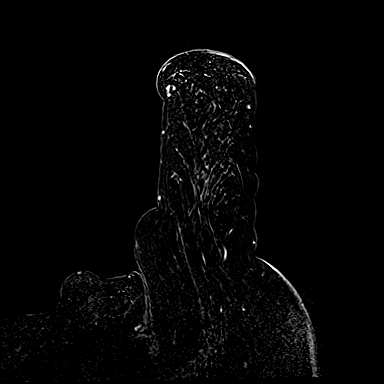
[im 160/160]
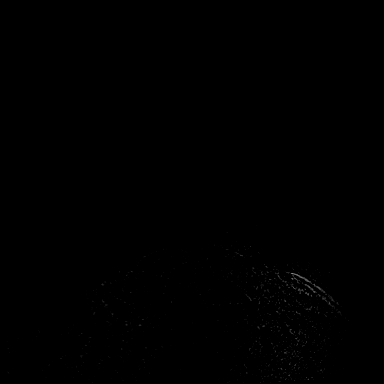

[Series 10: dynamic post 10 · axial · 1.3mm · 0.73mm/px · z∈[-72,+135]mm · 3 of 160 slices shown (1 of 2)]
[im 1/160]
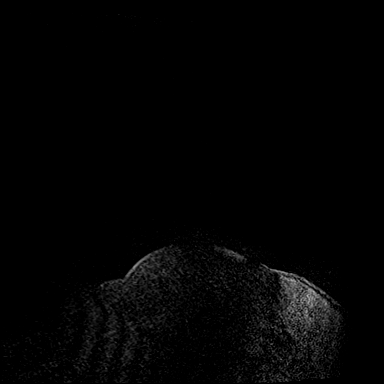
[im 80/160]
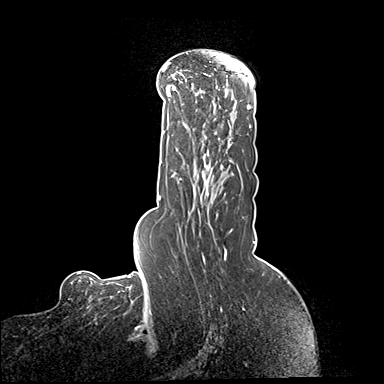
[im 160/160]
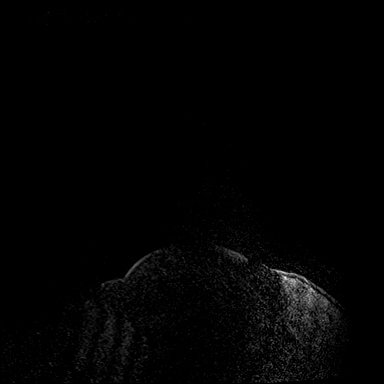

[Series 11: dynamic post 10 · axial · 1.3mm · 0.73mm/px · z∈[-72,+135]mm · 3 of 159 slices shown (2 of 2)]
[im 1/159]
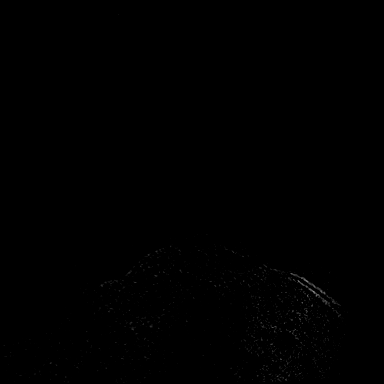
[im 80/159]
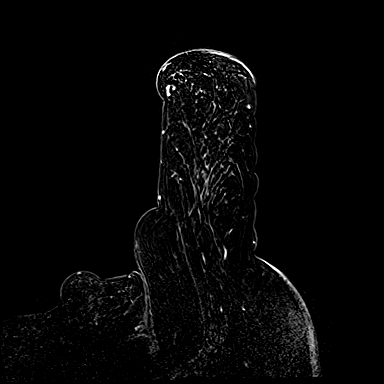
[im 159/159]
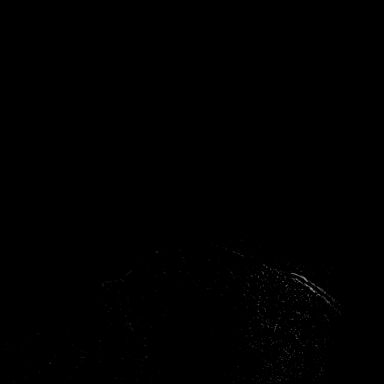

[Series 12: needle confirmation · axial · 1.3mm · 0.73mm/px · z∈[-72,+31]mm · 2 of 160 slices shown]
[im 1/160]
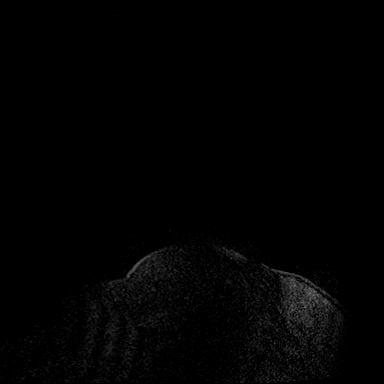
[im 80/160]
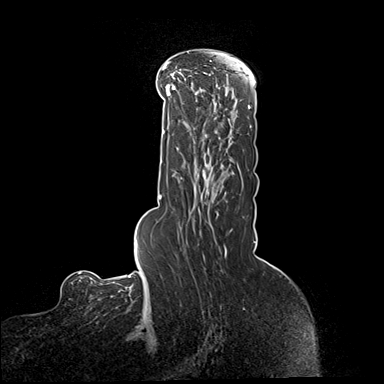

[32 of 48 positions shown; findings below may reference images not displayed]

FINDINGS: I met with the patient, and we discussed the procedure of MRI guided
biopsy, including risks, benefits, and alternatives. Specifically,
we discussed the risks of infection, bleeding, tissue injury, clip
migration, and inadequate sampling. Informed, written consent was
given. The usual time out protocol was performed immediately prior
to the procedure.

Using sterile technique, 1% Lidocaine, MRI guidance, and a 9 gauge
vacuum assisted device, biopsy was performed of the recently
demonstrated 0.9 cm enhancing mass in the lower outer left breast
using a lateral approach. At the conclusion of the procedure, a
barbell shaped tissue marker clip was deployed into the biopsy
cavity. Follow-up 2-view mammogram was performed and dictated
separately.
IMPRESSION: MRI guided biopsy of a 0.9 cm enhancing mass in the lower outer left
breast. No apparent complications.

ADDENDUM:
Pathology revealed FIBROCYSTIC CHANGES WITH APOCRINE METAPLASIA AND
CALCIFICATIONS of the LEFT breast, lower outer quadrant, (barbell
clip). This was found to be concordant by Dr. MMA DITHOTO.

Pathology results were discussed with the patient by telephone. The
patient reported doing well after the biopsy with tenderness at the
site. Post biopsy instructions and care were reviewed and questions
were answered. The patient was encouraged to call The [REDACTED] for any additional concerns. My direct phone
number was provided.

The patient has a diagnosis of RIGHT breast cancer and should follow
her outlined treatment plan.

Dr. MMA DITHOTO was notified of biopsy results and recommendations
via [REDACTED] message on [DATE].

NOTE: At least 2 additional biopsies were recommended given
extensive calcifications seen in the RIGHT breast if breast
conservation is being considered. Per the initial diagnostic report
dated [DATE] the calcifications in the lower outer quadrant were
noted to span approximately 11 cm.

The patient was instructed to return for a bilateral breast MRI in 6
months, per protocol.

Pathology results reported by MMA DITHOTO, RN on [DATE].

*** End of Addendum ***
FINDINGS: I met with the patient, and we discussed the procedure of MRI guided
biopsy, including risks, benefits, and alternatives. Specifically,
we discussed the risks of infection, bleeding, tissue injury, clip
migration, and inadequate sampling. Informed, written consent was
given. The usual time out protocol was performed immediately prior
to the procedure.

Using sterile technique, 1% Lidocaine, MRI guidance, and a 9 gauge
vacuum assisted device, biopsy was performed of the recently
demonstrated 0.9 cm enhancing mass in the lower outer left breast
using a lateral approach. At the conclusion of the procedure, a
barbell shaped tissue marker clip was deployed into the biopsy
cavity. Follow-up 2-view mammogram was performed and dictated
separately.
IMPRESSION: MRI guided biopsy of a 0.9 cm enhancing mass in the lower outer left
breast. No apparent complications.

## 2021-06-03 IMAGING — MG MM BREAST LOCALIZATION CLIP
4 series · 4 of 12 positions shown · non-contrast
Comparison: Previous exam(s).

CLINICAL DATA: Status post MR guided core needle biopsy of a 9 mm
mass in the lower outer quadrant of the left breast, marked with a
barbell shaped biopsy marker clip.

EXAM:
3D DIAGNOSTIC LEFT MAMMOGRAM POST MRI BIOPSY

[L ML synth-2D]
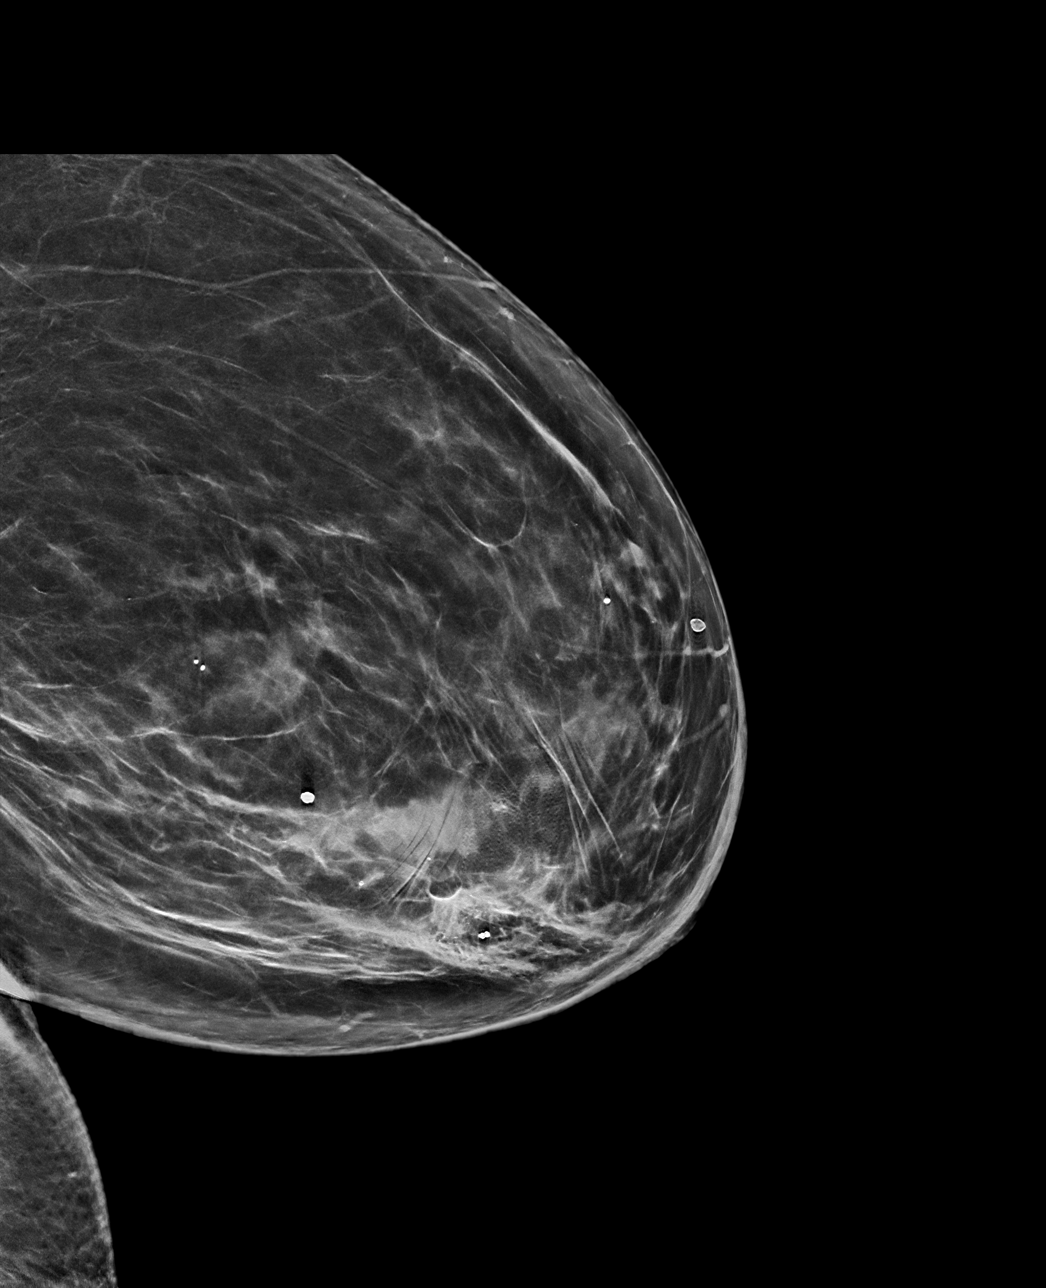

[L CC synth-2D]
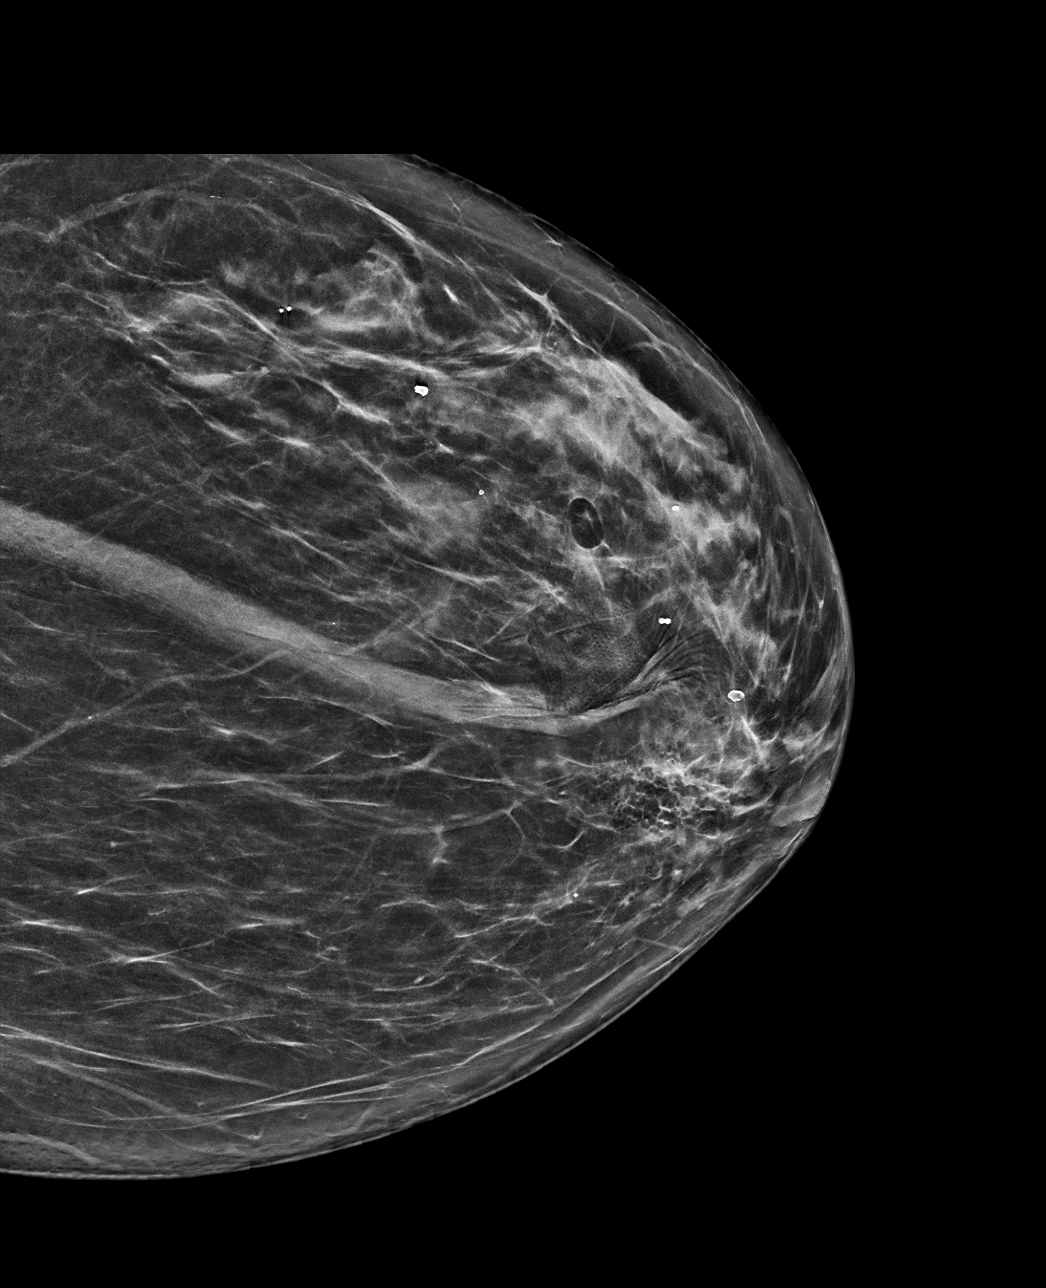

[L CC tomo · tomo slice 35/70.0]
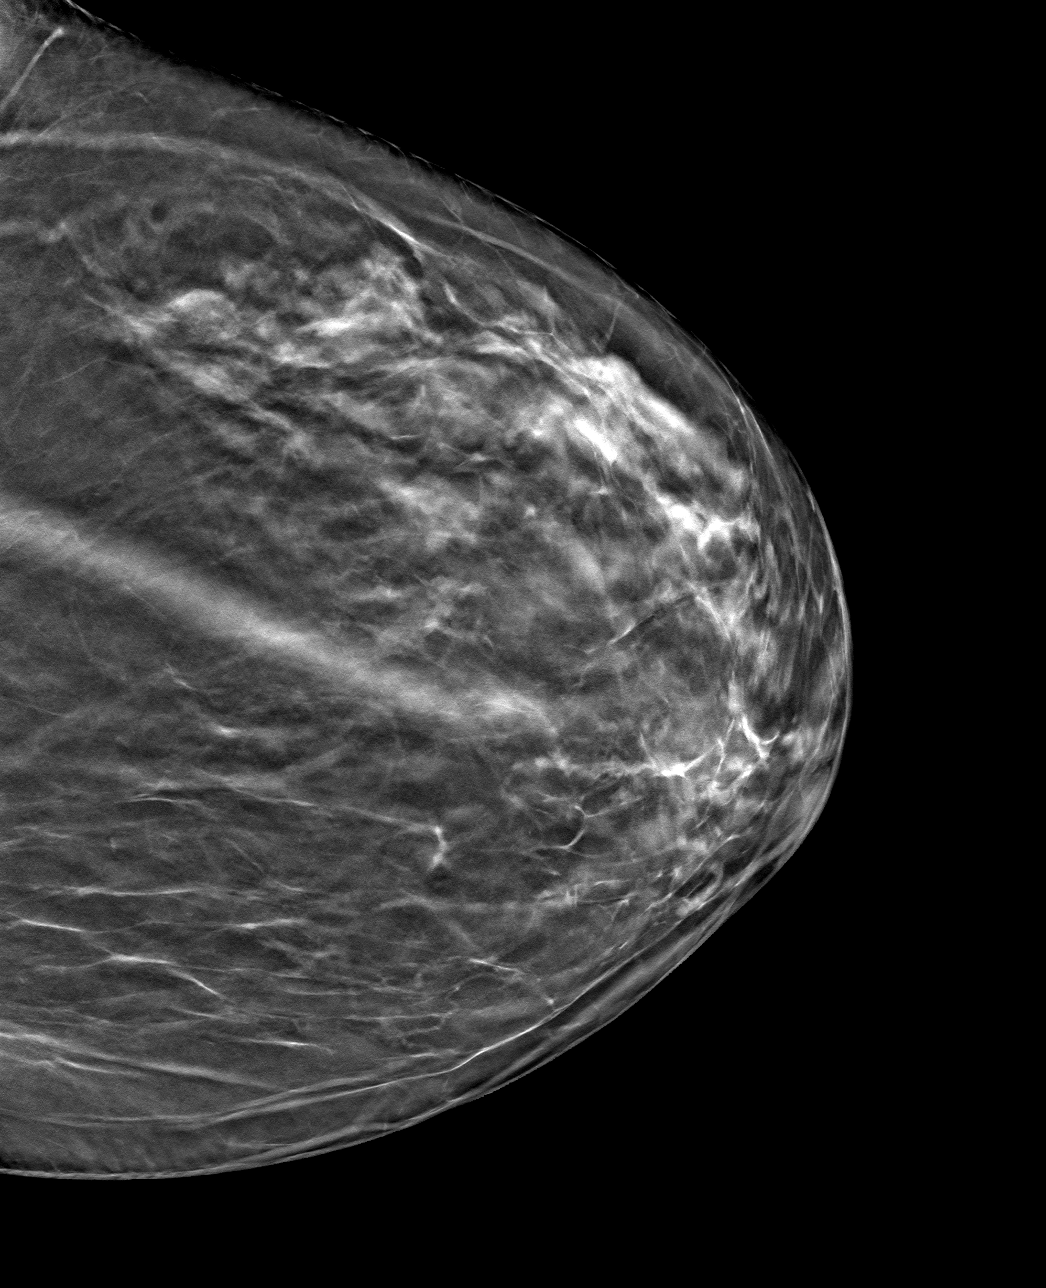

[L ML tomo · tomo slice 41/81.0]
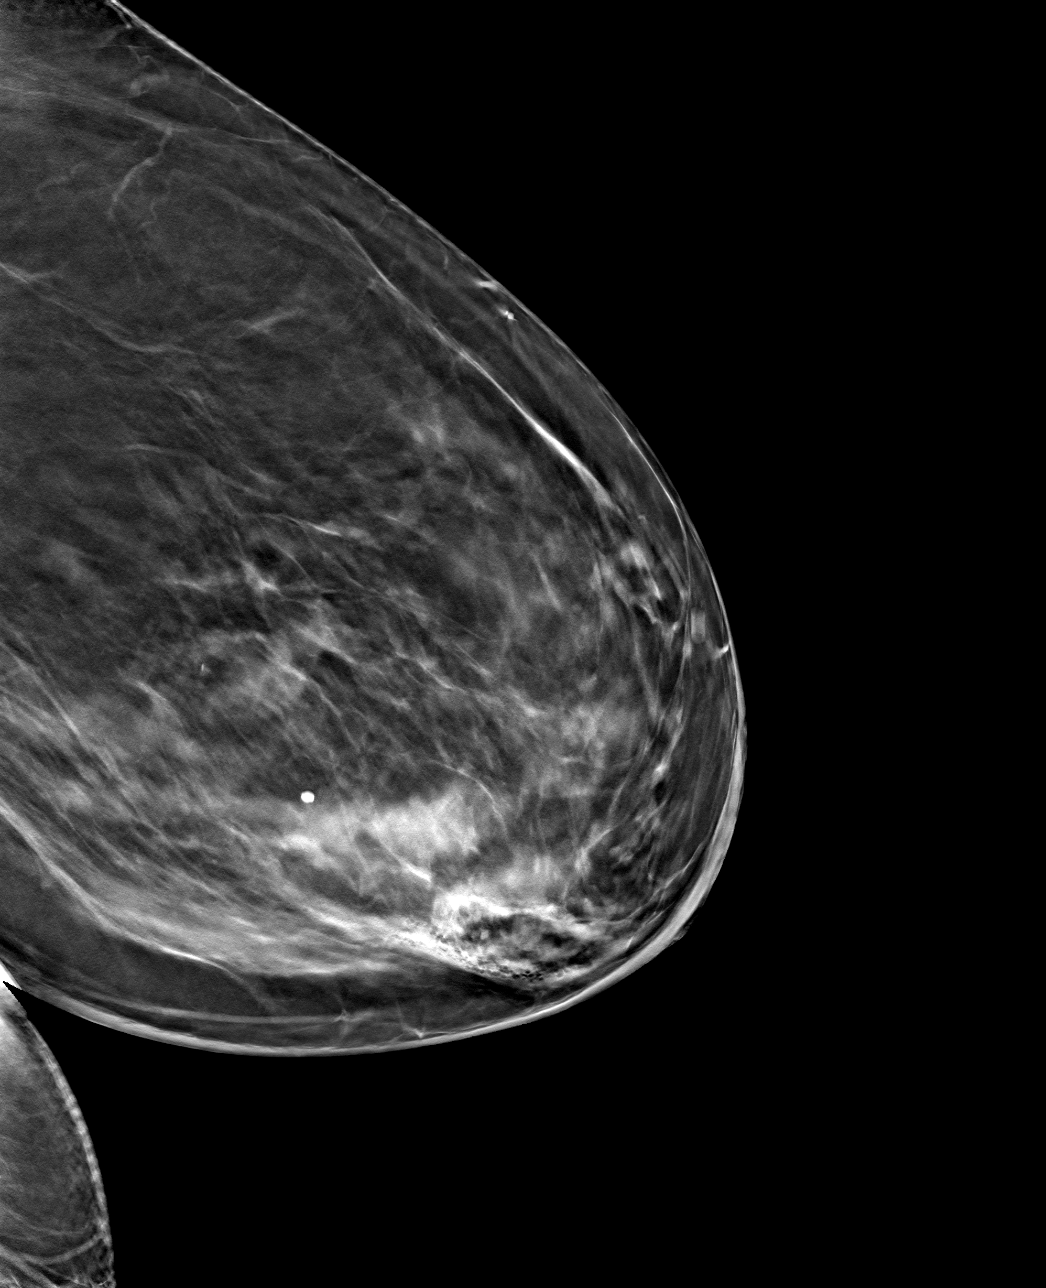

[4 of 12 positions shown; findings below may reference images not displayed]

FINDINGS: 3D Mammographic images were obtained following MR guided biopsy of
the recently demonstrated 0.9 cm mass in the lower outer quadrant of
the left breast. The biopsy marking clip is in expected position at
the site of biopsy.
IMPRESSION: Appropriate positioning of the barbell shaped biopsy marking clip at
the site of biopsy in the lower outer quadrant of the left breast.

Final Assessment: Post Procedure Mammograms for Marker Placement

## 2021-06-03 MED ORDER — GADOBUTROL 1 MMOL/ML IV SOLN
10.0000 mL | Freq: Once | INTRAVENOUS | Status: AC | PRN
  Administered 2021-06-03: 10 mL via INTRAVENOUS

## 2021-06-11 ENCOUNTER — Telehealth: Payer: Self-pay

## 2021-06-11 ENCOUNTER — Other Ambulatory Visit: Payer: Self-pay | Admitting: Hematology and Oncology

## 2021-06-11 MED ORDER — ANASTROZOLE 1 MG PO TABS: 1.0000 mg | ORAL_TABLET | Freq: Every day | ORAL | 0 refills | Status: DC

## 2021-06-11 NOTE — Telephone Encounter (Signed)
Pt called and requests refill on Anastrozole, stating she will run out before her appt next week. Pt states she is tolerating medication. Refill sent for 30 days.  ?

## 2021-06-17 ENCOUNTER — Other Ambulatory Visit: Payer: Self-pay

## 2021-06-17 ENCOUNTER — Inpatient Hospital Stay: Payer: 59 | Attending: Hematology and Oncology | Admitting: Hematology and Oncology

## 2021-06-17 VITALS — BP 148/66 | HR 87 | Temp 97.5°F | Resp 18 | Ht 62.0 in | Wt 221.1 lb

## 2021-06-17 DIAGNOSIS — J449 Chronic obstructive pulmonary disease, unspecified: Secondary | ICD-10-CM | POA: Insufficient documentation

## 2021-06-17 DIAGNOSIS — I10 Essential (primary) hypertension: Secondary | ICD-10-CM | POA: Insufficient documentation

## 2021-06-17 DIAGNOSIS — Z803 Family history of malignant neoplasm of breast: Secondary | ICD-10-CM | POA: Insufficient documentation

## 2021-06-17 DIAGNOSIS — K5900 Constipation, unspecified: Secondary | ICD-10-CM | POA: Insufficient documentation

## 2021-06-17 DIAGNOSIS — D0511 Intraductal carcinoma in situ of right breast: Secondary | ICD-10-CM | POA: Diagnosis present

## 2021-06-17 DIAGNOSIS — Z800 Family history of malignant neoplasm of digestive organs: Secondary | ICD-10-CM | POA: Insufficient documentation

## 2021-06-17 DIAGNOSIS — Z8042 Family history of malignant neoplasm of prostate: Secondary | ICD-10-CM | POA: Insufficient documentation

## 2021-06-17 DIAGNOSIS — E1165 Type 2 diabetes mellitus with hyperglycemia: Secondary | ICD-10-CM | POA: Diagnosis not present

## 2021-06-17 DIAGNOSIS — Z13820 Encounter for screening for osteoporosis: Secondary | ICD-10-CM | POA: Diagnosis not present

## 2021-06-17 DIAGNOSIS — F17210 Nicotine dependence, cigarettes, uncomplicated: Secondary | ICD-10-CM | POA: Diagnosis not present

## 2021-06-17 DIAGNOSIS — Z90710 Acquired absence of both cervix and uterus: Secondary | ICD-10-CM | POA: Insufficient documentation

## 2021-06-17 NOTE — Progress Notes (Signed)
[facility] ?CONSULT NOTE ? ?Patient Care Team: ?Monico Blitz, MD as PCP - General (Internal Medicine) ?Mauro Kaufmann, RN as Oncology Nurse Navigator ?Rockwell Germany, RN as Oncology Nurse Navigator ? ?CHIEF COMPLAINTS/PURPOSE OF CONSULTATION:  ?DCIS right breast ? ?ASSESSMENT & PLAN:  ? 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  During her initial visit to discuss about management recommendations of DCIS, we have discussed about breast conserving surgery followed by radiation plus endocrine therapy versus mastectomy followed by adjuvant endocrine therapy.  She was very reluctant to proceed with surgery and was going to discussed with Dr. Ninfa Linden regarding the recommendations after having an MRI.   ?She is here for toxicity check on anastrozole.  She has been tolerating anastrozole very well.  She reports some left-sided neck pain like a shooting pain down her arm but she wonders if this is related to her previous whiplash injury another head injuries.  She otherwise denies any major arthralgias or myalgias.  She has been taking it every day as prescribed. ?We will repeat mammogram and ultrasound in 3 months to assess response.  For now she does not want to proceed with definitive surgery.  She understands that aromatase inhibitors alone will not be able to cure this cancer. ?Return to clinic in 3 months ? ? HISTORY OF PRESENTING ILLNESS:  ? ?Rebecca [name] 76 y.o. female is here because of DCIS right breast. ? ?This is a very pleasant 76 year old female patient who was diagnosed with ductal carcinoma in situ of the right breast.  She underwent routine screening mammogram and significant calcifications were seen in her  right breast.  She had 2 different biopsies on the right breast. ? ?Right breast needle core biopsy of the lower outer quadrant showed ductal carcinoma in situ with calcifications and necrosis, lobular neoplasia.  Right breast needle core biopsy of the lower inner right breast showed complex sclerosing lesion with usual ductal hyperplasia and calcifications.  Prognostic showed ER, 100%, positive, strong staining intensity ?PR, 100%, positive, strong staining intensity. ? ?She was seen by Dr. Ninfa Linden and given the extent of disease it was recommended that she undergo breast MRIs if she desires breast conservation.  Given her medical history and COPD, she was also apparently uncertain of how she wants to proceed and whether if she wants to proceed with surgery or not.  She was referred to medical oncology and radiation oncology for further recommendations and MRI was ordered. ? ?During her last visit we have discussed about standard of care recommendations for DCIS including lumpectomy followed by radiation and adjuvant endocrine therapy versus mastectomy followed by adjuvant endocrine therapy.  She was debating to proceed with surgery.  Since last visit she had an MRI of her breast and also made the decision not to proceed with surgery hence she is back to discuss about endocrine therapy with medical oncology. ? ?She says she has bad hip which causes pain, this is chronic, basically arthritis related pain ?She has a nebulizer for her bronchitis/COPD but she doesn't use it. She smokes 1 1/2 PPD per day. ?She has been having constipation, has been straining to have a bowel movement, notices some blood after straining. ?She had a colonoscopy some time ago, may be about 3/4 yrs ago.  Dad's brother had colon cancer in 86's. ?Her DM is uncontrolled, last Hb A 1c was 7.1 approx. ?HTN is moderately controlled. ? ?Interval history ?She is here for toxicity check on anastrozole, tolerating it very well.  She denies any  complaints except for an episode of neck pain which she attributes to her whiplash injury.  No other change in her arthralgias.  No hot flashes or vaginal dryness noted.  For now she does not want to proceed with surgery.  She will try taking the anastrozole for some more time and then decide. ?She had previous breast biopsy of left breast many yrs ago ?Rest of the pertinent 10 point ROS reviewed and negative. ? ?MEDICAL HISTORY:  ?Past Medical History:  ?Diagnosis Date  ? Breast cancer ([place]) 01/18/2021  ? COPD (chronic obstructive pulmonary disease) ([place])   ? Depression   ? GERD (gastroesophageal reflux disease)   ? Hyperlipemia   ? Hypertension   ? Osteoporosis   ? Sleep apnea   ? has not taken test,snores, may have it  ? ? ?SURGICAL HISTORY: ?Past Surgical History:  ?Procedure Laterality Date  ? ABDOMINAL HYSTERECTOMY    ? BREAST SURGERY    ? lt lumpectomy-not cancer  ? CARPAL TUNNEL RELEASE  10/26/2011  ? Procedure: CARPAL TUNNEL RELEASE;  Surgeon: Wynonia Sours, MD;  Location: [facility];  Service: Orthopedics;  Laterality: Left;  ? CARPECTOMY  10/26/2011  ? Procedure: CARPECTOMY;  Surgeon: Wynonia Sours, MD;  Location: [facility];  Service: Orthopedics;  Laterality: Left;  proximal row carpectomy left wrist   ? CATARACT EXTRACTION W/PHACO Left 04/13/2018  ? Procedure: CATARACT EXTRACTION PHACO AND INTRAOCULAR LENS PLACEMENT (IOC);  Surgeon: Baruch Goldmann, MD;  Location: [facility];  Service: Ophthalmology;  Laterality: Left;  CDE: 14.92  ? CATARACT EXTRACTION W/PHACO Right 05/22/2018  ? Procedure: CATARACT EXTRACTION PHACO AND INTRAOCULAR LENS PLACEMENT RIGHT EYE (CDE: 16.73);  Surgeon: Baruch Goldmann, MD;  Location: [facility];  Service: Ophthalmology;  Laterality: Right;  ? COLONOSCOPY    ? KNEE ARTHROSCOPY    ? right  ? TONSILLECTOMY  x2  ? ? ?SOCIAL HISTORY: ?Social History  ? ?Socioeconomic History  ? Marital status: Divorced  ?  Spouse name: Not on file  ? Number of children: Not on file   ? Years of education: Not on file  ? Highest education level: Not on file  ?Occupational History  ? Not on file  ?Tobacco Use  ? Smoking status: Every Day  ?  Packs/day: 1.50  ?  Types: Cigarettes  ? Smokeless tobacco: Never  ?Vaping Use  ? Vaping Use: Never used  ?Substance and Sexual Activity  ? Alcohol use: No  ? Drug use: Never  ? Sexual activity: Not on file  ?Other Topics Concern  ? Not on file  ?Social History Narrative  ? Not on file  ? ?Social Determinants of Health  ? ?Financial Resource Strain: Not on file  ?Food Insecurity: Not on file  ?Transportation Needs: Not on file  ?Physical Activity: Not on file  ?Stress: Not on file  ?Social Connections: Not on file  ?Intimate Partner Violence: Not At Risk  ? Fear of Current or Ex-Partner: No  ? Emotionally Abused: No  ? Physically Abused: No  ? Sexually Abused: No  ? ? ?FAMILY HISTORY: ?Family History  ?Problem Relation Age of Onset  ? Prostate cancer Father   ? Breast cancer Maternal Aunt   ? Breast cancer Cousin   ? ? ?  ALLERGIES:  has No Known Allergies. ? ?MEDICATIONS:  ?Current Outpatient Medications  ?Medication Sig Dispense Refill  ? diclofenac (VOLTAREN) 75 MG EC tablet Take 75 mg by mouth 2 (two) times daily.    ? albuterol (VENTOLIN HFA) 108 (90 Base) MCG/ACT inhaler Inhale into the lungs.    ? anastrozole (ARIMIDEX) 1 MG tablet Take 1 tablet (1 mg total) by mouth daily. 30 tablet 0  ? aspirin 81 MG chewable tablet Chew 81 mg by mouth daily.    ? budesonide-formoterol (SYMBICORT) 80-4.5 MCG/ACT inhaler Inhale 2 puffs into the lungs 2 (two) times daily. (Patient not taking: Reported on 02/17/2021)    ? buPROPion (WELLBUTRIN SR) 100 MG 12 hr tablet Take 100 mg by mouth 2 (two) times daily.    ? cetirizine (ZYRTEC) 10 MG tablet Take 10 mg by mouth daily.    ? cholecalciferol (VITAMIN D3) 25 MCG (1000 UT) tablet Take 1,000 Units by mouth daily.    ? docusate sodium (COLACE) 100 MG capsule Take 100 mg by mouth 2 (two) times daily.    ? hydrochlorothiazide  (HYDRODIURIL) 25 MG tablet Take 25 mg by mouth daily.    ? ipratropium-albuterol (DUONEB) 0.5-2.5 (3) MG/3ML SOLN Take 3 mLs by nebulization every 6 (six) hours as needed. (Patient not taking: Reported on 12/21

## 2021-06-25 ENCOUNTER — Telehealth: Payer: 59 | Admitting: *Deleted

## 2021-06-30 ENCOUNTER — Other Ambulatory Visit: Payer: Self-pay | Admitting: Hematology and Oncology

## 2021-07-01 ENCOUNTER — Telehealth: Payer: Self-pay | Admitting: *Deleted

## 2021-07-07 ENCOUNTER — Telehealth: Payer: Self-pay

## 2021-07-07 MED ORDER — ANASTROZOLE 1 MG PO TABS: 1.0000 mg | ORAL_TABLET | Freq: Every day | ORAL | 3 refills | Status: DC

## 2021-07-07 NOTE — Telephone Encounter (Signed)
Pt called for clarification on appt with DNP Friday. Advised pt this would be a telephone visit. Pt asks for refill on anastrozole. OK per MD.  ?

## 2021-07-09 ENCOUNTER — Inpatient Hospital Stay: Payer: 59 | Attending: Hematology and Oncology | Admitting: Adult Health

## 2021-07-09 ENCOUNTER — Encounter: Payer: Self-pay | Admitting: Adult Health

## 2021-07-09 DIAGNOSIS — Z8042 Family history of malignant neoplasm of prostate: Secondary | ICD-10-CM

## 2021-07-09 DIAGNOSIS — F17210 Nicotine dependence, cigarettes, uncomplicated: Secondary | ICD-10-CM

## 2021-07-09 DIAGNOSIS — D0511 Intraductal carcinoma in situ of right breast: Secondary | ICD-10-CM

## 2021-07-09 DIAGNOSIS — Z803 Family history of malignant neoplasm of breast: Secondary | ICD-10-CM

## 2021-07-09 DIAGNOSIS — I10 Essential (primary) hypertension: Secondary | ICD-10-CM

## 2021-07-09 NOTE — Progress Notes (Signed)
SURVIVORSHIP VIRTUAL VISIT: ? ?I connected with Rebecca [name] on 07/09/21 at 11:15 AM EDT by telephone and verified that I am speaking with the correct person using two identifiers.  ?I discussed the limitations, risks, security and privacy concerns of performing an evaluation and management service by telephone and the availability of in person appointments. I also discussed with the patient that there may be a patient responsible charge related to this service. The patient expressed understanding and agreed to proceed.  ? ?Patient location: home in [place] ?Provider location: home in [place], [place] ?Others participating in call: none ? ?BRIEF ONCOLOGIC HISTORY:  ?Oncology History  ?Ductal carcinoma in situ (DCIS) of right breast  ?01/18/2021 Initial Diagnosis  ? Right breast needle core biopsy of the lower outer quadrant showed ductal carcinoma in situ with calcifications and necrosis, lobular neoplasia.  Right breast needle core biopsy of the lower inner right breast showed complex sclerosing lesion with usual ductal hyperplasia and calcifications.  Prognostic showed ER, 100%, positive, strong staining intensity ?PR, 100%, positive, strong staining intensity. ?  ?02/2021 -  Anti-estrogen oral therapy  ? Anastrozole daily ?  ? ? ?INTERVAL HISTORY:  ?[name] to review her survivorship care plan detailing her treatment course for breast cancer, as well as monitoring long-term side effects of that treatment, education regarding health maintenance, screening, and overall wellness and health promotion.    ? ?Overall, [name] reports feeling quite well.  She is taking Anastrozole daily and tolerates it well.  She is unclear as to the overall plan for monitoring her breast cancer.   ? ?REVIEW OF SYSTEMS:  ?Review of Systems  ?Constitutional:  Negative for appetite change, chills, fatigue, fever and unexpected weight change.  ?HENT:   Negative for hearing loss, lump/mass and trouble swallowing.   ?Eyes:  Negative for eye  problems and icterus.  ?Respiratory:  Positive for shortness of breath (chronic related to COPD). Negative for chest tightness and cough.   ?Cardiovascular:  Negative for chest pain, leg swelling and palpitations.  ?Gastrointestinal:  Negative for abdominal distention, abdominal pain, constipation, diarrhea, nausea and vomiting.  ?Endocrine: Negative for hot flashes.  ?Genitourinary:  Negative for difficulty urinating.   ?Musculoskeletal:  Negative for arthralgias.  ?Skin:  Negative for itching and rash.  ?Neurological:  Negative for dizziness, extremity weakness, headaches and numbness.  ?Hematological:  Negative for adenopathy. Does not bruise/bleed easily.  ?Psychiatric/Behavioral:  Negative for depression. The patient is not nervous/anxious.   ?Breast: Denies any new nodularity, masses, tenderness, nipple changes, or nipple discharge.  ? ? ? ? ?ONCOLOGY TREATMENT TEAM:  ?1. Surgeon:  Dr. Ninfa Linden at [facility] ?2. Medical Oncologist: Dr. Chryl Heck  ?3. Radiation Oncologist: Dr. Lisbeth Renshaw ?  ? ?PAST MEDICAL/SURGICAL HISTORY:  ?Past Medical History:  ?Diagnosis Date  ? Breast cancer ([place]) 01/18/2021  ? COPD (chronic obstructive pulmonary disease) ([place])   ? Depression   ? GERD (gastroesophageal reflux disease)   ? Hyperlipemia   ? Hypertension   ? Osteoporosis   ? Sleep apnea   ? has not taken test,snores, may have it  ? ?Past Surgical History:  ?Procedure Laterality Date  ? ABDOMINAL HYSTERECTOMY    ? BREAST SURGERY    ? lt lumpectomy-not cancer  ? CARPAL TUNNEL RELEASE  10/26/2011  ? Procedure: CARPAL TUNNEL RELEASE;  Surgeon: Wynonia Sours, MD;  Location: [place] [place];  Service: Orthopedics;  Laterality: Left;  ? CARPECTOMY  10/26/2011  ? Procedure: CARPECTOMY;  Surgeon: Wynonia Sours,  MD;  Location: [place];  Service: Orthopedics;  Laterality: Left;  proximal row carpectomy left wrist   ? CATARACT EXTRACTION W/PHACO Left 04/13/2018  ? Procedure: CATARACT EXTRACTION PHACO AND  INTRAOCULAR LENS PLACEMENT (IOC);  Surgeon: Baruch Goldmann, MD;  [facility];  Service: Ophthalmology;  Laterality: Left;  CDE: 14.92  ? CATARACT EXTRACTION W/PHACO Right 05/22/2018  ? Procedure: CATARACT EXTRACTION PHACO AND INTRAOCULAR LENS PLACEMENT RIGHT EYE (CDE: 16.73);  Surgeon: Baruch Goldmann, MD;  Location: [facility];  Service: Ophthalmology;  Laterality: Right;  ? COLONOSCOPY    ? KNEE ARTHROSCOPY    ? right  ? TONSILLECTOMY  x2  ? ? ? ?ALLERGIES:  ?No Known Allergies ? ? ?CURRENT MEDICATIONS:  ?Outpatient Encounter Medications as of 07/09/2021  ?Medication Sig  ? albuterol (VENTOLIN HFA) 108 (90 Base) MCG/ACT inhaler Inhale into the lungs. (Patient not taking: Reported on 06/17/2021)  ? anastrozole (ARIMIDEX) 1 MG tablet Take 1 tablet (1 mg total) by mouth daily.  ? aspirin 81 MG chewable tablet Chew 81 mg by mouth daily.  ? budesonide-formoterol (SYMBICORT) 80-4.5 MCG/ACT inhaler Inhale 2 puffs into the lungs 2 (two) times daily. (Patient not taking: Reported on 02/17/2021)  ? buPROPion (WELLBUTRIN SR) 100 MG 12 hr tablet Take 100 mg by mouth 2 (two) times daily.  ? cetirizine (ZYRTEC) 10 MG tablet Take 10 mg by mouth daily.  ? docusate sodium (COLACE) 100 MG capsule Take 100 mg by mouth 2 (two) times daily.  ? hydrochlorothiazide (HYDRODIURIL) 25 MG tablet Take 25 mg by mouth daily.  ? ipratropium-albuterol (DUONEB) 0.5-2.5 (3) MG/3ML SOLN Take 3 mLs by nebulization every 6 (six) hours as needed. (Patient not taking: Reported on 02/24/2021)  ? losartan (COZAAR) 25 MG tablet Take 25 mg by mouth daily.  ? metFORMIN (GLUCOPHAGE) 1000 MG tablet Take 1,000 mg by mouth 2 (two) times daily.  ? metoprolol succinate (TOPROL-XL) 50 MG 24 hr tablet Take 50 mg by mouth daily. Take with or immediately following a meal.  ? Multiple Vitamin (MULTIVITAMIN WITH MINERALS) TABS tablet Take 1 tablet by mouth daily.  ? Omega-3 1000 MG CAPS Take 1,000 mg by mouth daily.  ? OneTouch Delica Lancets 79T MISC SMARTSIG:1 Unit(s)  Topical Daily  ? ONETOUCH VERIO test strip 1 each daily.  ? Plecanatide 3 MG TABS Take 3 mg by mouth daily as needed (IBS).  ? Polyethyl Glycol-Propyl Glycol (SYSTANE OP) Place 1 drop into both eyes daily as needed (dry eyes).  ? Semaglutide,0.25 or 0.'5MG'$ /DOS, 2 MG/1.5ML SOPN Inject 0.25 mg into the skin every 7 (seven) days.  ? simvastatin (ZOCOR) 5 MG tablet Take 5 mg by mouth daily.  ? ?No facility-administered encounter medications on file as of 07/09/2021.  ? ? ? ?ONCOLOGIC FAMILY HISTORY:  ?Family History  ?Problem Relation Age of Onset  ? Prostate cancer Father   ? Breast cancer Maternal Aunt   ? Breast cancer Cousin   ? ? ? ?GENETIC COUNSELING/TESTING: ?Not at this time ? ?SOCIAL HISTORY:  ?Social History  ? ?Socioeconomic History  ? Marital status: Divorced  ?  Spouse name: Not on file  ? Number of children: Not on file  ? Years of education: Not on file  ? Highest education level: Not on file  ?Occupational History  ? Not on file  ?Tobacco Use  ? Smoking status: Every Day  ?  Packs/day: 1.50  ?  Types: Cigarettes  ? Smokeless tobacco: Never  ?Vaping Use  ? Vaping Use:  Never used  ?Substance and Sexual Activity  ? Alcohol use: No  ? Drug use: Never  ? Sexual activity: Not on file  ?Other Topics Concern  ? Not on file  ?Social History Narrative  ? Not on file  ? ?Social Determinants of Health  ? ?Financial Resource Strain: Not on file  ?Food Insecurity: Not on file  ?Transportation Needs: Not on file  ?Physical Activity: Not on file  ?Stress: Not on file  ?Social Connections: Not on file  ?Intimate Partner Violence: Not At Risk  ? Fear of Current or Ex-Partner: No  ? Emotionally Abused: No  ? Physically Abused: No  ? Sexually Abused: No  ? ? ? ?OBSERVATIONS/OBJECTIVE:  ?Patient sounds well. She is in no apparent distress.  Her mood and behavior are normal.   ? ?LABORATORY DATA:  ?None for this visit. ? ?DIAGNOSTIC IMAGING:  ?None for this visit.  ? ?  ? ?ASSESSMENT AND PLAN:  ?Ms.Rebecca [name] is a pleasant 75 y.o.  female with Stage 0 right breast DCIS, ER+/PR+, diagnosed in 01/2021, treated with and anti-estrogen therapy with Anastrozole beginning in 03/2021 and monitoring.  [facility]

## 2021-08-17 ENCOUNTER — Telehealth: Payer: Self-pay | Admitting: Hematology and Oncology

## 2021-08-17 NOTE — Telephone Encounter (Signed)
Rescheduled appointment per providers. Patient aware.  ? ?

## 2021-09-10 ENCOUNTER — Telehealth: Payer: Self-pay | Admitting: *Deleted

## 2021-09-10 NOTE — Telephone Encounter (Signed)
This RN was able to contact the [facility] per calling the supervisor Crissie due to prior calls and no answer after 10 minutes ( nurse had to d/c call due to other clinic needs ).  Per discussion with Crissie- explained need for dx mammo with u/s per expected date of 09/01/2021,  Crissie gave time for mm for 7/10 at 310 pm.  ( Of note the only reason she could see as to why not scheduled is pt's last mammo was October 2022).  Note pt has DCIS and is receiving neoadjuvant antiestrogen therapy and is under high surveilleince  This RN called pt to give above information- she stated she is  unsure if she is able to make that time- but will check with her family member who provides transportation. If she cannot make it she will call the breast center to change appt and then if needed to call this RN to tweak follow up appt with provider.

## 2021-09-13 ENCOUNTER — Ambulatory Visit
Admission: RE | Admit: 2021-09-13 | Discharge: 2021-09-13 | Disposition: A | Discharge status: Home / Self Care | Payer: 59 | Source: Ambulatory Visit | Attending: Adult Health | Admitting: Adult Health

## 2021-09-13 ENCOUNTER — Other Ambulatory Visit: Payer: Self-pay | Admitting: Adult Health

## 2021-09-13 DIAGNOSIS — D0511 Intraductal carcinoma in situ of right breast: Secondary | ICD-10-CM

## 2021-09-13 DIAGNOSIS — N6489 Other specified disorders of breast: Secondary | ICD-10-CM

## 2021-09-17 ENCOUNTER — Inpatient Hospital Stay: Payer: 59 | Attending: Hematology and Oncology | Admitting: Hematology and Oncology

## 2021-09-17 ENCOUNTER — Other Ambulatory Visit: Payer: Self-pay

## 2021-09-17 ENCOUNTER — Encounter: Payer: Self-pay | Admitting: Hematology and Oncology

## 2021-09-17 ENCOUNTER — Other Ambulatory Visit: Payer: Self-pay | Admitting: Hematology and Oncology

## 2021-09-17 VITALS — BP 138/66 | HR 75 | Temp 97.7°F | Resp 18 | Ht 62.0 in | Wt 217.2 lb

## 2021-09-17 DIAGNOSIS — D0511 Intraductal carcinoma in situ of right breast: Secondary | ICD-10-CM | POA: Diagnosis not present

## 2021-09-17 DIAGNOSIS — Z7951 Long term (current) use of inhaled steroids: Secondary | ICD-10-CM | POA: Insufficient documentation

## 2021-09-17 DIAGNOSIS — F17210 Nicotine dependence, cigarettes, uncomplicated: Secondary | ICD-10-CM | POA: Diagnosis not present

## 2021-09-17 DIAGNOSIS — Z170 Estrogen receptor positive status [ER+]: Secondary | ICD-10-CM | POA: Diagnosis not present

## 2021-09-17 DIAGNOSIS — Z79899 Other long term (current) drug therapy: Secondary | ICD-10-CM | POA: Insufficient documentation

## 2021-09-17 DIAGNOSIS — E1165 Type 2 diabetes mellitus with hyperglycemia: Secondary | ICD-10-CM | POA: Diagnosis not present

## 2021-09-17 DIAGNOSIS — Z79811 Long term (current) use of aromatase inhibitors: Secondary | ICD-10-CM | POA: Diagnosis not present

## 2021-09-17 DIAGNOSIS — J449 Chronic obstructive pulmonary disease, unspecified: Secondary | ICD-10-CM | POA: Insufficient documentation

## 2021-09-17 DIAGNOSIS — K5900 Constipation, unspecified: Secondary | ICD-10-CM | POA: Insufficient documentation

## 2021-09-17 DIAGNOSIS — Z7984 Long term (current) use of oral hypoglycemic drugs: Secondary | ICD-10-CM | POA: Insufficient documentation

## 2021-09-17 DIAGNOSIS — Z7982 Long term (current) use of aspirin: Secondary | ICD-10-CM | POA: Insufficient documentation

## 2021-09-17 DIAGNOSIS — I10 Essential (primary) hypertension: Secondary | ICD-10-CM | POA: Insufficient documentation

## 2021-09-17 NOTE — Progress Notes (Signed)
Mammogram and [place] orders placed by our team already  Rebecca [name]

## 2021-09-17 NOTE — Progress Notes (Signed)
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    During her initial visit to discuss about management recommendations of DCIS, we have discussed about breast conserving surgery followed by radiation plus endocrine therapy versus mastectomy followed by adjuvant endocrine therapy.  She was very reluctant to proceed with surgery and was going to discussed with Dr. Ninfa Linden regarding the recommendations after having an MRI.    She is here for toxicity check on anastrozole.  She has been tolerating anastrozole very well except for some increased arthralgias.  Most recent mammogram and ultrasound with stable calcifications, 76-monthmammogram and ultrasound scheduled in January 2024.  Her previous breast exam was unremarkable. Rest of the physical examination without any concerns. Return to clinic in 6 months.   HISTORY OF PRESENTING ILLNESS:   Rebecca MORINE74y.o. female is here because of DCIS right breast.  This is a very pleasant 76year old female patient who was diagnosed with ductal carcinoma in situ of the right breast.  She underwent routine screening mammogram and significant calcifications were seen in her right breast.  She had 2 different biopsies on the right breast.  Right breast needle  core biopsy of the lower outer quadrant showed ductal carcinoma in situ with calcifications and necrosis, lobular neoplasia.  Right breast needle core biopsy of the lower inner right breast showed complex sclerosing lesion with usual ductal hyperplasia and calcifications.  Prognostic showed ER, 100%, positive, strong staining intensity PR, 100%, positive, strong staining intensity.  She was seen by Dr. BNinfa Lindenand given the extent of disease it was recommended that she undergo breast MRIs if she desires breast conservation.  Given her medical history and COPD, she was also apparently uncertain of how she wants to proceed and whether if she wants to proceed with surgery or not.  She was referred to medical oncology and radiation oncology for further recommendations and MRI was ordered.  During her last visit we have discussed about standard of care recommendations for DCIS including lumpectomy followed by radiation and adjuvant endocrine therapy versus mastectomy followed by adjuvant endocrine therapy.  She was debating to proceed with surgery.  Since last visit she had an MRI of her breast and also made the decision not to proceed with surgery hence she is back to discuss about endocrine therapy with medical oncology.  She says she has bad hip which causes pain, this is chronic, basically arthritis related pain She has a nebulizer for her bronchitis/COPD but she doesn't use it. She smokes 1 1/2 PPD per day. She has been having constipation, has been straining to have a bowel movement, notices some blood after straining. She had a colonoscopy some time ago, may be about 3/4 yrs ago. Dad's brother had colon cancer in 726's Her DM is uncontrolled, last Hb A 1c was 7.1 approx. HTN is moderately controlled.  Interval    She is here for  toxicity check on anastrozole, tolerating it very well.   She complains of some increased arthralgias especially in her hip joint, otherwise tolerating anastrozole very  well.  She has some baseline hot flashes and episodes of spotting which has not significantly changed.  She had her mammogram recently which shows stable calcifications in the right breast and some distortion of the left breast which will be monitored.  She was recommended to undergo mammogram and ultrasound in 6 months.  Rest of the pertinent 10 point ROS reviewed and negative.  MEDICAL HISTORY:  Past Medical History:  Diagnosis Date   Breast cancer ([facility]) 01/18/2021   COPD (chronic obstructive pulmonary disease) ([facility])    Depression    GERD (gastroesophageal reflux disease)    Hyperlipemia    Hypertension    Osteoporosis    Sleep apnea    has not taken test,snores, may have it    SURGICAL HISTORY: Past Surgical History:  Procedure Laterality Date   ABDOMINAL HYSTERECTOMY     BREAST SURGERY     lt lumpectomy-not cancer   CARPAL TUNNEL RELEASE  10/26/2011   Procedure: CARPAL TUNNEL RELEASE;  Surgeon: Wynonia Sours, MD;  Location: [facility];  Service: Orthopedics;  Laterality: Left;   CARPECTOMY  10/26/2011   Procedure: CARPECTOMY;  Surgeon: Wynonia Sours, MD;  Location: [facility];  Service: Orthopedics;  Laterality: Left;  proximal row carpectomy left wrist    CATARACT EXTRACTION W/PHACO Left 04/13/2018   Procedure: CATARACT EXTRACTION PHACO AND INTRAOCULAR LENS PLACEMENT ([facility]);  Surgeon: Baruch Goldmann, MD;  Location: [facility];  Service: Ophthalmology;  Laterality: Left;  CDE: 14.92   CATARACT EXTRACTION W/PHACO Right 05/22/2018   Procedure: CATARACT EXTRACTION PHACO AND INTRAOCULAR LENS PLACEMENT RIGHT EYE (CDE: 16.73);  Surgeon: Baruch Goldmann, MD;  Location: [facility];  Service: Ophthalmology;  Laterality: Right;   COLONOSCOPY     KNEE ARTHROSCOPY     right   TONSILLECTOMY  x2    SOCIAL HISTORY: Social History   Socioeconomic History   Marital status: Divorced    Spouse name: Not on file   Number of children: Not on file   Years of education: Not on  file   Highest education level: Not on file  Occupational History   Not on file  Tobacco Use   Smoking status: Every Day    Packs/day: 1.50    Types: Cigarettes   Smokeless tobacco: Never  Vaping Use   Vaping Use: Never used  Substance and Sexual Activity   Alcohol use: No   Drug use: Never   Sexual activity: Not on file  Other Topics Concern   Not on file  Social History Narrative   Not on file   Social Determinants of Health   Financial Resource Strain: Not on file  Food Insecurity: Not on file  Transportation Needs: Not on file  Physical Activity: Not on file  Stress: Not on file  Social Connections: Not on file  Intimate Partner Violence: Not At Risk (02/17/2021)   Humiliation, Afraid, Rape, and Kick questionnaire    Fear of Current or Ex-Partner: No    Emotionally Abused: No    Physically Abused: No    Sexually Abused: No    FAMILY HISTORY: Family History  Problem Relation Age of Onset   Prostate cancer Father    Breast cancer Maternal Aunt    Breast cancer Cousin     ALLERGIES:  has No Known Allergies.  MEDICATIONS:  Current Outpatient  Medications  Medication Sig Dispense Refill   albuterol (VENTOLIN HFA) 108 (90 Base) MCG/ACT inhaler Inhale into the lungs. (Patient not taking: Reported on 06/17/2021)     anastrozole (ARIMIDEX) 1 MG tablet Take 1 tablet (1 mg total) by mouth daily. 90 tablet 3   aspirin 81 MG chewable tablet Chew 81 mg by mouth daily.     budesonide-formoterol (SYMBICORT) 80-4.5 MCG/ACT inhaler Inhale 2 puffs into the lungs 2 (two) times daily. (Patient not taking: Reported on 02/17/2021)     buPROPion (WELLBUTRIN SR) 100 MG 12 hr tablet Take 100 mg by mouth 2 (two) times daily.     cetirizine (ZYRTEC) 10 MG tablet Take 10 mg by mouth daily.     docusate sodium (COLACE) 100 MG capsule Take 100 mg by mouth 2 (two) times daily.     hydrochlorothiazide (HYDRODIURIL) 25 MG tablet Take 25 mg by mouth daily.     ipratropium-albuterol (DUONEB)  0.5-2.5 (3) MG/3ML SOLN Take 3 mLs by nebulization every 6 (six) hours as needed. (Patient not taking: Reported on 02/24/2021)     losartan (COZAAR) 25 MG tablet Take 25 mg by mouth daily.     metFORMIN (GLUCOPHAGE) 1000 MG tablet Take 1,000 mg by mouth 2 (two) times daily.     metoprolol succinate (TOPROL-XL) 50 MG 24 hr tablet Take 50 mg by mouth daily. Take with or immediately following a meal.     Multiple Vitamin (MULTIVITAMIN WITH MINERALS) TABS tablet Take 1 tablet by mouth daily.     Omega-3 1000 MG CAPS Take 1,000 mg by mouth daily.     OneTouch Delica Lancets 68T MISC SMARTSIG:1 Unit(s) Topical Daily     ONETOUCH VERIO test strip 1 each daily.     Plecanatide 3 MG TABS Take 3 mg by mouth daily as needed (IBS).     Polyethyl Glycol-Propyl Glycol (SYSTANE OP) Place 1 drop into both eyes daily as needed (dry eyes).     Semaglutide,0.25 or 0.'5MG'$ /DOS, 2 MG/1.5ML SOPN Inject 0.25 mg into the skin every 7 (seven) days.     simvastatin (ZOCOR) 5 MG tablet Take 5 mg by mouth daily.     No current facility-administered medications for this visit.    PHYSICAL EXAMINATION: ECOG PERFORMANCE STATUS: 0 - Asymptomatic  Vitals:   09/17/21 1341  BP: 138/66  Pulse: 75  Resp: 18  Temp: 97.7 F (36.5 C)  SpO2: 98%   Physical Exam Constitutional:      Appearance: Normal appearance.  Cardiovascular:     Rate and Rhythm: Normal rate and regular rhythm.     Pulses: Normal pulses.     Heart sounds: Murmur heard.  Musculoskeletal:        General: Swelling (Baseline 1+ lower extremity swelling) present.     Cervical back: Normal range of motion and neck supple. No rigidity.  Lymphadenopathy:     Cervical: No cervical adenopathy.  Skin:    General: Skin is warm and dry.  Neurological:     General: No focal deficit present.     Mental Status: She is alert.     Breast exam deferred, recently had breast exam by Dr. Trevor Mace office LABORATORY DATA:  I have reviewed the data as  listed Lab Results  Component Value Date   WBC 10.2 04/06/2018   HGB 12.2 04/06/2018   HCT 38.3 04/06/2018   MCV 88.2 04/06/2018   PLT 333 04/06/2018     Chemistry      Component Value Date/Time  NA 137 04/06/2018 1130   K 3.8 04/06/2018 1130   CL 101 04/06/2018 1130   CO2 27 04/06/2018 1130   BUN 20 04/06/2018 1130   CREATININE 0.85 04/06/2018 1130      Component Value Date/Time   CALCIUM 9.0 04/06/2018 1130     Have reviewed pertinent imaging and pathology reports  Pathology  Diagnosis 1. Breast, right, needle core biopsy, Lower outer right breast - DUCTAL CARCINOMA IN SITU WITH CALCIFICATIONS AND NECROSIS - LOBULAR NEOPLASIA (ATYPICAL LOBULAR HYPERPLASIA) - SEE COMMENT 2. Breast, right, needle core biopsy, Lower inner right breast - COMPLEX SCLEROSING LESION WITH USUAL DUCTAL HYPERPLASIA AND CALCIFICATIONS - SEE COMMENT  IMMUNOHISTOCHEMICAL AND MORPHOMETRIC ANALYSIS PERFORMED MANUALLY Estrogen Receptor: 100%, POSITIVE, STRONG STAINING INTENSITY Progesterone Receptor: 100%, POSITIVE, STRONG STAINING INTENSITY  RADIOGRAPHIC STUDIES: I have personally reviewed the radiological images as listed and agreed with the findings in the report. MM DIAG BREAST TOMO BILATERAL  Result Date: 09/13/2021 CLINICAL DATA:  History of DCIS. Patient had 2 site stereotactic biopsy of calcifications in the RIGHT breast 01/18/2021. Pathology showed intermediate grade ductal carcinoma in situ with calcifications and necrosis and atypical lobular hyperplasia in the LOWER OUTER QUADRANT (X clip which migrated 7 centimeters MEDIAL to biopsy site). Complex sclerosing lesion with usual duct hyperplasia and calcifications was identified in the LOWER INNER QUADRANT of the RIGHT breast (coil clip). Patient opted for antiestrogen therapy over surgery due to history of COPD. On 06/03/2021, patient MR guided core biopsy of a mass in the LOWER OUTER QUADRANT of the LEFT breast (barbell clip). Pathology  showed benign concordant fibrocystic changes with metaplasia and calcifications. EXAM: DIGITAL DIAGNOSTIC BILATERAL MAMMOGRAM WITH TOMOSYNTHESIS AND CAD; ULTRASOUND LEFT BREAST LIMITED TECHNIQUE: Bilateral digital diagnostic mammography and breast tomosynthesis was performed. The images were evaluated with computer-aided detection.; Targeted ultrasound examination of the left breast was performed. COMPARISON:  Multiple prior studies ACR Breast Density Category b: There are scattered areas of fibroglandular density. FINDINGS: RIGHT breast: Magnified views are performed of calcifications throughout the LATERAL portion of the RIGHT breast. Numerous groups of fine pleomorphic calcifications persist in a similar distribution. Tissue marker clips are present in the LOWER INNER quadrants following prior stereotactic biopsies. Of note, the X shaped clip migrated at least 7 centimeters MEDIAL to the biopsy site. No new masses or areas of distortion. LEFT breast: Within the LOWER OUTER QUADRANT of the LEFT breast, there is an area of distortion confirmed on spot compression views. The distortion is just LATERAL to a barbell clip, possibly representing the site of the MR guided biopsy. On physical exam, there is a well-healed needle biopsy site in the 3:30 o'clock location of the LEFT breast. I palpate no mass in the LOWER OUTER QUADRANT. Targeted ultrasound is performed, showing an irregular hypoechoic mass immediately below the needle biopsy site in the 3:30 o'clock location of the LEFT breast 7 centimeters from the nipple, measuring approximately 1.3 x 0.7 centimeters. No internal blood flow identified. The barbell shaped clip is not identified sonographically. Evaluation of the LEFT axilla is negative for adenopathy. A BB is placed on the MR biopsy site. Follow-up spot tangential view confirms that the area of distortion correlates well with the site of the MR guided core biopsy and is likely the cause of the distortion.  IMPRESSION: 1. Stable appearance and distribution of calcifications throughout the LATERAL portion of the RIGHT breast, spanning the UPPER-OUTER and LOWER-OUTER quadrants. 2. No developing mass or distortion in the RIGHT breast. 3. Focal distortion  in the [place] LEFT breast is likely related to MR biopsy site. RECOMMENDATION: Recommend bilateral diagnostic mammogram and LEFT breast ultrasound in 6 months to assess stability of the above probably benign findings. I have discussed the findings and recommendations with the patient. If applicable, a reminder letter will be sent to the patient regarding the next appointment. BI-RADS CATEGORY  3: Probably benign. Electronically Signed   By: Nolon Nations M.D.   On: 09/13/2021 16:47  US BREAST LTD UNI LEFT INC AXILLA  Result Date: 09/13/2021 CLINICAL DATA:  History of DCIS. Patient had 2 site stereotactic biopsy of calcifications in the RIGHT breast 01/18/2021. Pathology showed intermediate grade ductal carcinoma in situ with calcifications and necrosis and atypical lobular hyperplasia in the LOWER OUTER QUADRANT (X clip which migrated 7 centimeters MEDIAL to biopsy site). Complex sclerosing lesion with usual duct hyperplasia and calcifications was identified in the LOWER INNER QUADRANT of the RIGHT breast (coil clip). Patient opted for antiestrogen therapy over surgery due to history of COPD. On 06/03/2021, patient MR guided core biopsy of a mass in the LOWER OUTER QUADRANT of the LEFT breast (barbell clip). Pathology showed benign concordant fibrocystic changes with metaplasia and calcifications. EXAM: DIGITAL DIAGNOSTIC BILATERAL MAMMOGRAM WITH TOMOSYNTHESIS AND CAD; ULTRASOUND LEFT BREAST LIMITED TECHNIQUE: Bilateral digital diagnostic mammography and breast tomosynthesis was performed. The images were evaluated with computer-aided detection.; Targeted ultrasound examination of the left breast was performed. COMPARISON:  Multiple prior studies ACR  Breast Density Category b: There are scattered areas of fibroglandular density. FINDINGS: RIGHT breast: Magnified views are performed of calcifications throughout the LATERAL portion of the RIGHT breast. Numerous groups of fine pleomorphic calcifications persist in a similar distribution. Tissue marker clips are present in the LOWER INNER quadrants following prior stereotactic biopsies. Of note, the X shaped clip migrated at least 7 centimeters MEDIAL to the biopsy site. No new masses or areas of distortion. LEFT breast: Within the LOWER OUTER QUADRANT of the LEFT breast, there is an area of distortion confirmed on spot compression views. The distortion is just LATERAL to a barbell clip, possibly representing the site of the MR guided biopsy. On physical exam, there is a well-healed needle biopsy site in the 3:30 o'clock location of the LEFT breast. I palpate no mass in the LOWER OUTER QUADRANT. Targeted ultrasound is performed, showing an irregular hypoechoic mass immediately below the needle biopsy site in the 3:30 o'clock location of the LEFT breast 7 centimeters from the nipple, measuring approximately 1.3 x 0.7 centimeters. No internal blood flow identified. The barbell shaped clip is not identified sonographically. Evaluation of the LEFT axilla is negative for adenopathy. A BB is placed on the MR biopsy site. Follow-up spot tangential view confirms that the area of distortion correlates well with the site of the MR guided core biopsy and is likely the cause of the distortion. IMPRESSION: 1. Stable appearance and distribution of calcifications throughout the LATERAL portion of the RIGHT breast, spanning the UPPER-OUTER and LOWER-OUTER quadrants. 2. No developing mass or distortion in the RIGHT breast. 3. Focal distortion in the LOWER OUTER QUADRANT LEFT breast is likely related to MR biopsy site. RECOMMENDATION: Recommend bilateral diagnostic mammogram and LEFT breast ultrasound in 6 months to assess stability  of the above probably benign findings. I have discussed the findings and recommendations with the patient. If applicable, a reminder letter will be sent to the patient regarding the next appointment. BI-RADS CATEGORY  3: Probably benign. Electronically Signed   By: Benjamine Mola  Owens Shark M.D.   On: 09/13/2021 16:47   All questions were answered. The patient knows to call the clinic with any problems, questions or concerns. I spent 20 minutes in the care of this patient including H and P, review of records, counseling and coordination of care.     Benay Pike, MD 09/17/2021 1:46 PM

## 2022-03-21 ENCOUNTER — Ambulatory Visit: Payer: 59 | Admitting: Hematology and Oncology

## 2022-03-22 ENCOUNTER — Ambulatory Visit
Admission: RE | Admit: 2022-03-22 | Discharge: 2022-03-22 | Disposition: A | Discharge status: Home / Self Care | Payer: 59 | Source: Ambulatory Visit | Attending: Adult Health | Admitting: Adult Health

## 2022-03-22 ENCOUNTER — Other Ambulatory Visit: Payer: Self-pay | Admitting: Adult Health

## 2022-03-22 DIAGNOSIS — R921 Mammographic calcification found on diagnostic imaging of breast: Secondary | ICD-10-CM

## 2022-03-22 DIAGNOSIS — D0511 Intraductal carcinoma in situ of right breast: Secondary | ICD-10-CM

## 2022-03-22 DIAGNOSIS — N6489 Other specified disorders of breast: Secondary | ICD-10-CM

## 2022-03-24 ENCOUNTER — Inpatient Hospital Stay: Payer: 59 | Attending: Hematology and Oncology | Admitting: Hematology and Oncology

## 2022-03-24 ENCOUNTER — Other Ambulatory Visit: Payer: Self-pay | Admitting: Hematology and Oncology

## 2022-03-24 ENCOUNTER — Other Ambulatory Visit: Payer: Self-pay

## 2022-03-24 VITALS — BP 142/56 | HR 76 | Temp 97.9°F | Resp 18 | Ht 62.0 in | Wt 209.6 lb

## 2022-03-24 DIAGNOSIS — Z803 Family history of malignant neoplasm of breast: Secondary | ICD-10-CM | POA: Insufficient documentation

## 2022-03-24 DIAGNOSIS — Z79811 Long term (current) use of aromatase inhibitors: Secondary | ICD-10-CM | POA: Diagnosis not present

## 2022-03-24 DIAGNOSIS — F17210 Nicotine dependence, cigarettes, uncomplicated: Secondary | ICD-10-CM | POA: Diagnosis not present

## 2022-03-24 DIAGNOSIS — Z90710 Acquired absence of both cervix and uterus: Secondary | ICD-10-CM | POA: Diagnosis not present

## 2022-03-24 DIAGNOSIS — D0511 Intraductal carcinoma in situ of right breast: Secondary | ICD-10-CM

## 2022-03-24 DIAGNOSIS — I10 Essential (primary) hypertension: Secondary | ICD-10-CM | POA: Diagnosis not present

## 2022-03-24 DIAGNOSIS — Z8042 Family history of malignant neoplasm of prostate: Secondary | ICD-10-CM | POA: Diagnosis not present

## 2022-03-24 NOTE — Progress Notes (Signed)
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  She refused surgery.  She is currently on antiestrogen therapy alone.  She most recently had a mammogram which showed improved appearance of the calcifications and a repeat 23-monthmammogram was recommended.  No concerns on physical exam today.  She will return to clinic in 6 months.  She will stay on anastrozole, she has been tolerating it really well.  She is motivated to quit smoking but does not really want to try the Chantix.  She is working with her PCP. All her questions were answered to the best my knowledge.   HISTORY OF PRESENTING ILLNESS:   Rebecca ALBERTA89y.o. female is here because of DCIS right breast.  This is a very pleasant 77year old female patient who was diagnosed with ductal carcinoma in situ of the right breast.  She underwent routine screening mammogram and significant calcifications were seen in her right breast.  She had 2 different biopsies on the right breast.  Right breast needle core biopsy of the lower outer quadrant showed ductal carcinoma in situ with calcifications and necrosis, lobular neoplasia.  Right breast needle core biopsy of the lower inner right breast showed complex sclerosing lesion with usual ductal hyperplasia and calcifications.  Prognostic showed ER, 100%,  positive, strong staining intensity PR, 100%, positive, strong staining intensity.  She was seen by Dr. BNinfa Lindenand given the extent of disease it was recommended that she undergo breast MRIs if she desires breast conservation.  Given her medical history and COPD, she was also apparently uncertain of how she wants to proceed and whether if she wants to proceed with surgery or not.  She was referred to medical oncology and radiation oncology for further recommendations and MRI was ordered.  During her last visit we have discussed about standard of care recommendations for DCIS including lumpectomy followed by radiation and adjuvant endocrine therapy versus mastectomy followed by adjuvant endocrine therapy.  She was debating to proceed with surgery.  Since last visit she had an MRI of her breast and also made the decision not to proceed with surgery hence she is back to discuss about endocrine therapy with medical oncology.  She says she has bad hip which causes pain, this is chronic, basically arthritis related pain She has a nebulizer for her bronchitis/COPD but she doesn't use it. She smokes 1 1/2 PPD per day. She has been having constipation, has been straining to have a bowel movement, notices some blood after straining. She had a colonoscopy some time ago, may be about 3/4 yrs ago. Dad's brother had colon cancer in 771's Her DM is uncontrolled, last Hb A 1c was 7.1 approx. HTN is moderately controlled.  Interval   Patient is here for follow-up by herself.  Since last visit, she continues on anastrozole.  She has been tolerating it really well.  She denies any adverse effects.  She tells me that she is  smoking too much and has effects from smoking but did not want to try the Chantix that was given to her.  She is thinking about it.  She is also on Ozempic for her diabetes and this has been giving her some nausea.  She otherwise denies any new complaints. She is here for toxicity check on  anastrozole, tolerating it very well.     Rest of the pertinent 10 point ROS reviewed and negative.  MEDICAL HISTORY:  Past Medical History:  Diagnosis Date   Breast cancer ([facility]) 01/18/2021   COPD (chronic obstructive pulmonary disease) ([facility])    Depression    GERD (gastroesophageal reflux disease)    Hyperlipemia    Hypertension    Osteoporosis    Sleep apnea    has not taken test,snores, may have it    SURGICAL HISTORY: Past Surgical History:  Procedure Laterality Date   ABDOMINAL HYSTERECTOMY     BREAST SURGERY     lt lumpectomy-not cancer   CARPAL TUNNEL RELEASE  10/26/2011   Procedure: CARPAL TUNNEL RELEASE;  Surgeon: Wynonia Sours, MD;  Location: [facility];  Service: Orthopedics;  Laterality: Left;   CARPECTOMY  10/26/2011   Procedure: CARPECTOMY;  Surgeon: Wynonia Sours, MD;  Location: [facility];  Service: Orthopedics;  Laterality: Left;  proximal row carpectomy left wrist    CATARACT EXTRACTION W/PHACO Left 04/13/2018   Procedure: CATARACT EXTRACTION PHACO AND INTRAOCULAR LENS PLACEMENT ([facility]);  Surgeon: Baruch Goldmann, MD;  Location: [facility];  Service: Ophthalmology;  Laterality: Left;  CDE: 14.92   CATARACT EXTRACTION W/PHACO Right 05/22/2018   Procedure: CATARACT EXTRACTION PHACO AND INTRAOCULAR LENS PLACEMENT RIGHT EYE (CDE: 16.73);  Surgeon: Baruch Goldmann, MD;  Location: [facility];  Service: Ophthalmology;  Laterality: Right;   COLONOSCOPY     KNEE ARTHROSCOPY     right   TONSILLECTOMY  x2    SOCIAL HISTORY: Social History   Socioeconomic History   Marital status: Divorced    Spouse name: Not on file   Number of children: Not on file   Years of education: Not on file   Highest education level: Not on file  Occupational History   Not on file  Tobacco Use   Smoking status: Every Day    Packs/day: 1.50    Types: Cigarettes   Smokeless tobacco: Never  Vaping Use   Vaping Use: Never used  Substance and Sexual Activity   Alcohol  use: No   Drug use: Never   Sexual activity: Not on file  Other Topics Concern   Not on file  Social History Narrative   Not on file   Social Determinants of Health   Financial Resource Strain: Not on file  Food Insecurity: Not on file  Transportation Needs: Not on file  Physical Activity: Not on file  Stress: Not on file  Social Connections: Not on file  Intimate Partner Violence: Not At Risk (02/17/2021)   Humiliation, Afraid, Rape, and Kick questionnaire    Fear of Current or Ex-Partner: No    Emotionally Abused: No    Physically Abused: No    Sexually Abused: No    FAMILY HISTORY: Family History  Problem Relation Age of Onset   Prostate cancer Father    Breast cancer Maternal Aunt    Breast cancer Cousin     ALLERGIES:  has No Known Allergies.  MEDICATIONS:  Current Outpatient Medications  Medication Sig Dispense Refill   albuterol (VENTOLIN HFA) 108 (90  Base) MCG/ACT inhaler Inhale into the lungs. (Patient not taking: Reported on 06/17/2021)     anastrozole (ARIMIDEX) 1 MG tablet Take 1 tablet (1 mg total) by mouth daily. 90 tablet 3   aspirin 81 MG chewable tablet Chew 81 mg by mouth daily.     budesonide-formoterol (SYMBICORT) 80-4.5 MCG/ACT inhaler Inhale 2 puffs into the lungs 2 (two) times daily. (Patient not taking: Reported on 02/17/2021)     buPROPion (WELLBUTRIN SR) 100 MG 12 hr tablet Take 100 mg by mouth 2 (two) times daily.     cetirizine (ZYRTEC) 10 MG tablet Take 10 mg by mouth daily.     docusate sodium (COLACE) 100 MG capsule Take 100 mg by mouth 2 (two) times daily.     hydrochlorothiazide (HYDRODIURIL) 25 MG tablet Take 25 mg by mouth daily.     ipratropium-albuterol (DUONEB) 0.5-2.5 (3) MG/3ML SOLN Take 3 mLs by nebulization every 6 (six) hours as needed. (Patient not taking: Reported on 02/24/2021)     losartan (COZAAR) 25 MG tablet Take 25 mg by mouth daily.     metFORMIN (GLUCOPHAGE) 1000 MG tablet Take 1,000 mg by mouth 2 (two) times daily.      metoprolol succinate (TOPROL-XL) 50 MG 24 hr tablet Take 50 mg by mouth daily. Take with or immediately following a meal.     Multiple Vitamin (MULTIVITAMIN WITH MINERALS) TABS tablet Take 1 tablet by mouth daily.     Omega-3 1000 MG CAPS Take 1,000 mg by mouth daily.     OneTouch Delica Lancets 57D MISC SMARTSIG:1 Unit(s) Topical Daily     ONETOUCH VERIO test strip 1 each daily.     Plecanatide 3 MG TABS Take 3 mg by mouth daily as needed (IBS).     Polyethyl Glycol-Propyl Glycol (SYSTANE OP) Place 1 drop into both eyes daily as needed (dry eyes).     Semaglutide,0.25 or 0.'5MG'$ /DOS, 2 MG/1.5ML SOPN Inject 0.25 mg into the skin every 7 (seven) days.     simvastatin (ZOCOR) 5 MG tablet Take 5 mg by mouth daily.     No current facility-administered medications for this visit.    PHYSICAL EXAMINATION: ECOG PERFORMANCE STATUS: 0 - Asymptomatic  Vitals:   03/24/22 1138  BP: (!) 142/56  Pulse: 76  Resp: 18  Temp: 97.9 F (36.6 C)  SpO2: 97%   Physical Exam Constitutional:      Appearance: Normal appearance.  Cardiovascular:     Rate and Rhythm: Normal rate and regular rhythm.     Pulses: Normal pulses.     Heart sounds: Murmur heard.  Musculoskeletal:        General: Swelling (Baseline 1+ lower extremity swelling) present.     Cervical back: Normal range of motion and neck supple. No rigidity.  Lymphadenopathy:     Cervical: No cervical adenopathy.  Skin:    General: Skin is warm and dry.  Neurological:     General: No focal deficit present.     Mental Status: She is alert.     Breast exam deferred, recently had breast exam by Dr. Trevor Mace office LABORATORY DATA:  I have reviewed the data as listed Lab Results  Component Value Date   WBC 10.2 04/06/2018   HGB 12.2 04/06/2018   HCT 38.3 04/06/2018   MCV 88.2 04/06/2018   PLT 333 04/06/2018     Chemistry      Component Value Date/Time   NA 137 04/06/2018 1130   K 3.8 04/06/2018 1130  CL 101 04/06/2018 1130    CO2 27 04/06/2018 1130   BUN 20 04/06/2018 1130   CREATININE 0.85 04/06/2018 1130      Component Value Date/Time   CALCIUM 9.0 04/06/2018 1130     Have reviewed pertinent imaging and pathology reports  Pathology  Diagnosis 1. Breast, right, needle core biopsy, Lower outer right breast - DUCTAL CARCINOMA IN SITU WITH CALCIFICATIONS AND NECROSIS - LOBULAR NEOPLASIA (ATYPICAL LOBULAR HYPERPLASIA) - SEE COMMENT 2. Breast, right, needle core biopsy, Lower inner right breast - COMPLEX SCLEROSING LESION WITH USUAL DUCTAL HYPERPLASIA AND CALCIFICATIONS - SEE COMMENT  IMMUNOHISTOCHEMICAL AND MORPHOMETRIC ANALYSIS PERFORMED MANUALLY Estrogen Receptor: 100%, POSITIVE, STRONG STAINING INTENSITY Progesterone Receptor: 100%, POSITIVE, STRONG STAINING INTENSITY  RADIOGRAPHIC STUDIES: I have personally reviewed the radiological images as listed and agreed with the findings in the report. MM DIAG BREAST TOMO BILATERAL  Result Date: 03/22/2022 CLINICAL DATA:  77 year old female with history of biopsy proven DCIS and ALH, as well as complex sclerosing lesion. The patient opted for antiestrogen therapy in lieu of surgery due to history of COPD. Left breast MRI guided biopsy also performed demonstrating benign concordant changes. She presents today for short-term follow-up EXAM: DIGITAL DIAGNOSTIC BILATERAL MAMMOGRAM WITH TOMOSYNTHESIS; ULTRASOUND LEFT BREAST LIMITED TECHNIQUE: Bilateral digital diagnostic mammography and breast tomosynthesis was performed.; Targeted ultrasound examination of the left breast was performed. COMPARISON:  Previous exam(s). ACR Breast Density Category b: There are scattered areas of fibroglandular density. FINDINGS: Fine pleomorphic calcifications within the lateral right breast are slightly decreased from prior comparison study. The overall distribution is mammographically stable with no new areas of calcifications identified. Stable post biopsy changes in clips again noted. No  other suspicious findings in the right breast. Within the left breast, focal distortion in the lower outer quadrant is less prominent on today's mammographic views. No other new or suspicious findings within the left breast. Targeted ultrasound is performed, showing decreased prominence for the irregular hypoechoic mass located at the 3:30 position 7 cm from the nipple. No other suspicious findings identified. IMPRESSION: 1. Stable to slightly improved appearance of right breast calcifications consistent with the patient's biopsy-proven sites of malignancy and atypia. Recommend continued short-term follow-up if the patient plans to defer surgery. 2. Interval improvement in mammographic and sonographic appearance of left breast distortion/mass in the lower outer quadrant. Recommend continued short-term follow-up as a precaution. RECOMMENDATION: Bilateral diagnostic mammogram and possible left breast ultrasound in 6 months. I have discussed the findings and recommendations with the patient. If applicable, a reminder letter will be sent to the patient regarding the next appointment. BI-RADS CATEGORY  6: Known biopsy-proven malignancy. Electronically Signed   By: Kristopher Oppenheim M.D.   On: 03/22/2022 14:52  US BREAST LTD UNI LEFT INC AXILLA  Result Date: 03/22/2022 CLINICAL DATA:  77 year old female with history of biopsy proven DCIS and ALH, as well as complex sclerosing lesion. The patient opted for antiestrogen therapy in lieu of surgery due to history of COPD. Left breast MRI guided biopsy also performed demonstrating benign concordant changes. She presents today for short-term follow-up EXAM: DIGITAL DIAGNOSTIC BILATERAL MAMMOGRAM WITH TOMOSYNTHESIS; ULTRASOUND LEFT BREAST LIMITED TECHNIQUE: Bilateral digital diagnostic mammography and breast tomosynthesis was performed.; Targeted ultrasound examination of the left breast was performed. COMPARISON:  Previous exam(s). ACR Breast Density Category b: There are  scattered areas of fibroglandular density. FINDINGS: Fine pleomorphic calcifications within the lateral right breast are slightly decreased from prior comparison study. The overall distribution is mammographically stable with no new  areas of calcifications identified. Stable post biopsy changes in clips again noted. No other suspicious findings in the right breast. Within the left breast, focal distortion in the lower outer quadrant is less prominent on today's mammographic views. No other new or suspicious findings within the left breast. Targeted ultrasound is performed, showing decreased prominence for the irregular hypoechoic mass located at the 3:30 position 7 cm from the nipple. No other suspicious findings identified. IMPRESSION: 1. Stable to slightly improved appearance of right breast calcifications consistent with the patient's biopsy-proven sites of malignancy and atypia. Recommend continued short-term follow-up if the patient plans to defer surgery. 2. Interval improvement in mammographic and sonographic appearance of left breast distortion/mass in the lower outer quadrant. Recommend continued short-term follow-up as a precaution. RECOMMENDATION: Bilateral diagnostic mammogram and possible left breast ultrasound in 6 months. I have discussed the findings and recommendations with the patient. If applicable, a reminder letter will be sent to the patient regarding the next appointment. BI-RADS CATEGORY  6: Known biopsy-proven malignancy. Electronically Signed   By: Kristopher Oppenheim M.D.   On: 03/22/2022 14:52   All questions were answered. The patient knows to call the clinic with any problems, questions or concerns. I spent 20 minutes in the care of this patient including H and P, review of records, counseling and coordination of care.     Benay Pike, MD 03/24/2022 12:00 PM

## 2022-04-13 ENCOUNTER — Other Ambulatory Visit: Payer: Self-pay | Admitting: *Deleted

## 2022-04-13 DIAGNOSIS — D0511 Intraductal carcinoma in situ of right breast: Secondary | ICD-10-CM

## 2022-04-13 MED ORDER — ANASTROZOLE 1 MG PO TABS: 1.0000 mg | ORAL_TABLET | Freq: Every day | ORAL | 3 refills | Status: DC

## 2022-04-13 NOTE — Telephone Encounter (Signed)
Received faxed refill request from [facility] for Anastrozole  1 mg, One tablet daily. Per Dr. Rob Hickman most recent [facility] note 03/24/22: "Stay on Anastrozole. Return to Clinic in 6 months" Patient scheduled for mammogram, [place], and MD appointment in July 2024 Refill sent per above information

## 2022-06-27 DIAGNOSIS — E1140 Type 2 diabetes mellitus with diabetic neuropathy, unspecified: Secondary | ICD-10-CM | POA: Diagnosis not present

## 2022-06-27 DIAGNOSIS — E1151 Type 2 diabetes mellitus with diabetic peripheral angiopathy without gangrene: Secondary | ICD-10-CM | POA: Diagnosis not present

## 2022-07-21 DIAGNOSIS — Z0000 Encounter for general adult medical examination without abnormal findings: Secondary | ICD-10-CM | POA: Diagnosis not present

## 2022-07-21 DIAGNOSIS — J449 Chronic obstructive pulmonary disease, unspecified: Secondary | ICD-10-CM | POA: Diagnosis not present

## 2022-07-21 DIAGNOSIS — Z1331 Encounter for screening for depression: Secondary | ICD-10-CM | POA: Diagnosis not present

## 2022-07-21 DIAGNOSIS — Z299 Encounter for prophylactic measures, unspecified: Secondary | ICD-10-CM | POA: Diagnosis not present

## 2022-07-21 DIAGNOSIS — L409 Psoriasis, unspecified: Secondary | ICD-10-CM | POA: Diagnosis not present

## 2022-07-21 DIAGNOSIS — I10 Essential (primary) hypertension: Secondary | ICD-10-CM | POA: Diagnosis not present

## 2022-07-21 DIAGNOSIS — Z7189 Other specified counseling: Secondary | ICD-10-CM | POA: Diagnosis not present

## 2022-07-21 DIAGNOSIS — Z1339 Encounter for screening examination for other mental health and behavioral disorders: Secondary | ICD-10-CM | POA: Diagnosis not present

## 2022-08-22 DIAGNOSIS — I10 Essential (primary) hypertension: Secondary | ICD-10-CM | POA: Diagnosis not present

## 2022-08-22 DIAGNOSIS — N183 Chronic kidney disease, stage 3 unspecified: Secondary | ICD-10-CM | POA: Diagnosis not present

## 2022-08-22 DIAGNOSIS — K5909 Other constipation: Secondary | ICD-10-CM | POA: Diagnosis not present

## 2022-08-22 DIAGNOSIS — E1122 Type 2 diabetes mellitus with diabetic chronic kidney disease: Secondary | ICD-10-CM | POA: Diagnosis not present

## 2022-08-22 DIAGNOSIS — E1165 Type 2 diabetes mellitus with hyperglycemia: Secondary | ICD-10-CM | POA: Diagnosis not present

## 2022-08-22 DIAGNOSIS — Z299 Encounter for prophylactic measures, unspecified: Secondary | ICD-10-CM | POA: Diagnosis not present

## 2022-09-12 DIAGNOSIS — E1151 Type 2 diabetes mellitus with diabetic peripheral angiopathy without gangrene: Secondary | ICD-10-CM | POA: Diagnosis not present

## 2022-09-12 DIAGNOSIS — E1140 Type 2 diabetes mellitus with diabetic neuropathy, unspecified: Secondary | ICD-10-CM | POA: Diagnosis not present

## 2022-09-21 ENCOUNTER — Ambulatory Visit
Admission: RE | Admit: 2022-09-21 | Discharge: 2022-09-21 | Disposition: A | Discharge status: Home / Self Care | Payer: Medicare HMO | Source: Ambulatory Visit | Attending: Adult Health | Admitting: Adult Health

## 2022-09-21 ENCOUNTER — Ambulatory Visit: Admission: RE | Admit: 2022-09-21 | Payer: 59 | Source: Ambulatory Visit

## 2022-09-21 DIAGNOSIS — D0511 Intraductal carcinoma in situ of right breast: Secondary | ICD-10-CM

## 2022-09-21 DIAGNOSIS — R921 Mammographic calcification found on diagnostic imaging of breast: Secondary | ICD-10-CM

## 2022-09-21 DIAGNOSIS — N6489 Other specified disorders of breast: Secondary | ICD-10-CM

## 2022-09-21 DIAGNOSIS — R928 Other abnormal and inconclusive findings on diagnostic imaging of breast: Secondary | ICD-10-CM | POA: Diagnosis not present

## 2022-09-23 ENCOUNTER — Ambulatory Visit: Payer: 59 | Admitting: Hematology and Oncology

## 2022-09-26 ENCOUNTER — Encounter: Payer: Self-pay | Admitting: Hematology and Oncology

## 2022-09-26 ENCOUNTER — Other Ambulatory Visit: Payer: Self-pay | Admitting: *Deleted

## 2022-09-26 ENCOUNTER — Inpatient Hospital Stay: Payer: Medicare HMO | Attending: Hematology and Oncology | Admitting: Hematology and Oncology

## 2022-09-26 ENCOUNTER — Other Ambulatory Visit: Payer: Self-pay

## 2022-09-26 ENCOUNTER — Other Ambulatory Visit: Payer: Self-pay | Admitting: Hematology and Oncology

## 2022-09-26 VITALS — BP 142/52 | HR 74 | Temp 97.9°F | Resp 16 | Wt 202.0 lb

## 2022-09-26 DIAGNOSIS — K5900 Constipation, unspecified: Secondary | ICD-10-CM | POA: Insufficient documentation

## 2022-09-26 DIAGNOSIS — R928 Other abnormal and inconclusive findings on diagnostic imaging of breast: Secondary | ICD-10-CM

## 2022-09-26 DIAGNOSIS — Z79811 Long term (current) use of aromatase inhibitors: Secondary | ICD-10-CM | POA: Insufficient documentation

## 2022-09-26 DIAGNOSIS — Z803 Family history of malignant neoplasm of breast: Secondary | ICD-10-CM | POA: Insufficient documentation

## 2022-09-26 DIAGNOSIS — D0511 Intraductal carcinoma in situ of right breast: Secondary | ICD-10-CM

## 2022-09-26 DIAGNOSIS — Z8042 Family history of malignant neoplasm of prostate: Secondary | ICD-10-CM | POA: Diagnosis not present

## 2022-09-26 DIAGNOSIS — F17210 Nicotine dependence, cigarettes, uncomplicated: Secondary | ICD-10-CM | POA: Diagnosis not present

## 2022-09-26 DIAGNOSIS — Z90710 Acquired absence of both cervix and uterus: Secondary | ICD-10-CM | POA: Diagnosis not present

## 2022-09-26 NOTE — Progress Notes (Signed)
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 She is on antiestrogen therapy alone, declined surgery.  She most recently had a mammogram and this showed faint linear calcs spanning 12 cm in the lateral portion of the right breast consistent with known DCIS.  There is a possible distortion in the lateral aspect of the right breast without discrete mass and stereotactic biopsy was recommended.  Patient initially refuses biopsy but is now willing to proceed with biopsy since this may change management.  She is willing to do this.  She shared get another biopsy, telephone visit in 1 month to review the pathology results and follow-up in 6 months or sooner as needed.  For constipation, we have discussed about stool softeners daily, as needed laxatives, she also takes Linzess.  Advised to drink a lot of water, fiber and to stay active.  All her questions were answered to the best my knowledge.   HISTORY OF PRESENTING ILLNESS:   Rebecca [name] 77 y.o. female is here because of DCIS right breast.  This is a very pleasant 77 year old female patient who was diagnosed with ductal carcinoma in situ of the right breast.  She underwent routine screening mammogram and significant calcifications were seen in her right breast.  She had 2 different biopsies on the right  breast.  Right breast needle core biopsy of the lower outer quadrant showed ductal carcinoma in situ with calcifications and necrosis, lobular neoplasia.  Right breast needle core biopsy of the lower inner right breast showed complex sclerosing lesion with usual ductal hyperplasia and calcifications.  Prognostic showed ER, 100%, positive, strong staining intensity PR, 100%, positive, strong staining intensity.  She was seen by Dr. Magnus Ivan and given the extent of disease it was recommended that she undergo breast MRIs if she desires breast conservation.  Given her medical history and COPD, she was also apparently uncertain of how she wants to proceed and whether if she wants to proceed with surgery or not.  She was referred to medical oncology and radiation oncology for further recommendations and MRI was ordered.  During her last visit we have discussed about standard of care recommendations for DCIS including lumpectomy followed by radiation and adjuvant endocrine therapy versus mastectomy followed by adjuvant endocrine therapy.  She was debating to proceed with surgery.  Since last visit she had an MRI of her breast and also made the decision not to proceed with surgery hence she is back to discuss about endocrine therapy with medical oncology.  She says she has bad hip which causes pain, this is chronic, basically arthritis related pain She has a nebulizer for her bronchitis/COPD but she doesn't use it. She smokes 1 1/2 PPD per day. She has been having constipation, has been straining to have a bowel movement, notices some blood after straining. She had a colonoscopy some time ago, may be about 3/4 yrs ago. Dad's brother had colon cancer in 79's. Her  DM is uncontrolled, last Hb A 1c was 7.1 approx. HTN is moderately controlled.  Interval   Patient is here for follow-up by herself.  Since last visit, she continues on anastrozole.  She is tolerating anastrozole very well.  With the calcium  supplementation however she has constipation and she is not happy with that.  She already has baseline constipation for which she takes stool softeners, as needed laxatives and Linzess.  Otherwise she also had a mammogram which showed a possible distortion in the lateral aspect of right breast without a discrete mass which is distinct from the other known area of DCIS.   Rest of the pertinent 10 point ROS reviewed and negative.  MEDICAL HISTORY:  Past Medical History:  Diagnosis Date   Breast cancer (HCC) 01/18/2021   COPD (chronic obstructive pulmonary disease) (HCC)    Depression    GERD (gastroesophageal reflux disease)    Hyperlipemia    Hypertension    Osteoporosis    Sleep apnea    has not taken test,snores, may have it    SURGICAL HISTORY: Past Surgical History:  Procedure Laterality Date   ABDOMINAL HYSTERECTOMY     BREAST SURGERY     lt lumpectomy-not cancer   CARPAL TUNNEL RELEASE  10/26/2011   Procedure: CARPAL TUNNEL RELEASE;  Surgeon: Nicki Reaper, MD;  Location: [facility];  Service: Orthopedics;  Laterality: Left;   CARPECTOMY  10/26/2011   Procedure: CARPECTOMY;  Surgeon: Nicki Reaper, MD;  Location: [facility];  Service: Orthopedics;  Laterality: Left;  proximal row carpectomy left wrist    CATARACT EXTRACTION W/PHACO Left 04/13/2018   Procedure: CATARACT EXTRACTION PHACO AND INTRAOCULAR LENS PLACEMENT (IOC);  Surgeon: Fabio Pierce, MD;  Location: [facility];  Service: Ophthalmology;  Laterality: Left;  CDE: 14.92   CATARACT EXTRACTION W/PHACO Right 05/22/2018   Procedure: CATARACT EXTRACTION PHACO AND INTRAOCULAR LENS PLACEMENT RIGHT EYE (CDE: 16.73);  Surgeon: Fabio Pierce, MD;  Location: [facility];  Service: Ophthalmology;  Laterality: Right;   COLONOSCOPY     KNEE ARTHROSCOPY     right   TONSILLECTOMY  x2    SOCIAL HISTORY: Social History   Socioeconomic History   Marital status: Divorced    Spouse name: Not on file   Number of  children: Not on file   Years of education: Not on file   Highest education level: Not on file  Occupational History   Not on file  Tobacco Use   Smoking status: Every Day    Current packs/day: 1.50    Types: Cigarettes   Smokeless tobacco: Never  Vaping Use   Vaping status: Never Used  Substance and Sexual Activity   Alcohol use: No   Drug use: Never   Sexual activity: Not on file  Other Topics Concern   Not on file  Social History Narrative   Not on file   Social Determinants of Health   Financial Resource Strain: Not on file  Food Insecurity: Not on file  Transportation Needs: Not on file  Physical Activity: Not on file  Stress: Not on file  Social Connections: Not on file  Intimate Partner Violence: Not At Risk (02/17/2021)   Humiliation, Afraid, Rape, and Kick questionnaire    Fear of Current or Ex-Partner: No    Emotionally Abused: No    Physically Abused: No    Sexually Abused: No    FAMILY HISTORY: Family History  Problem Relation Age of Onset   Prostate cancer  Father    Breast cancer Maternal Aunt    Breast cancer Cousin     ALLERGIES:  has No Known Allergies.  MEDICATIONS:  Current Outpatient Medications  Medication Sig Dispense Refill   albuterol (VENTOLIN HFA) 108 (90 Base) MCG/ACT inhaler Inhale into the lungs. (Patient not taking: Reported on 06/17/2021)     anastrozole (ARIMIDEX) 1 MG tablet Take 1 tablet (1 mg total) by mouth daily. 90 tablet 3   aspirin 81 MG chewable tablet Chew 81 mg by mouth daily.     budesonide-formoterol (SYMBICORT) 80-4.5 MCG/ACT inhaler Inhale 2 puffs into the lungs 2 (two) times daily. (Patient not taking: Reported on 02/17/2021)     buPROPion (WELLBUTRIN SR) 100 MG 12 hr tablet Take 100 mg by mouth 2 (two) times daily.     cetirizine (ZYRTEC) 10 MG tablet Take 10 mg by mouth daily.     docusate sodium (COLACE) 100 MG capsule Take 100 mg by mouth 2 (two) times daily.     hydrochlorothiazide (HYDRODIURIL) 25 MG tablet  Take 25 mg by mouth daily.     ipratropium-albuterol (DUONEB) 0.5-2.5 (3) MG/3ML SOLN Take 3 mLs by nebulization every 6 (six) hours as needed. (Patient not taking: Reported on 02/24/2021)     losartan (COZAAR) 25 MG tablet Take 25 mg by mouth daily.     metFORMIN (GLUCOPHAGE) 1000 MG tablet Take 1,000 mg by mouth 2 (two) times daily.     metoprolol succinate (TOPROL-XL) 50 MG 24 hr tablet Take 50 mg by mouth daily. Take with or immediately following a meal.     Multiple Vitamin (MULTIVITAMIN WITH MINERALS) TABS tablet Take 1 tablet by mouth daily.     Omega-3 1000 MG CAPS Take 1,000 mg by mouth daily.     OneTouch Delica Lancets 33G MISC SMARTSIG:1 Unit(s) Topical Daily     ONETOUCH VERIO test strip 1 each daily.     Plecanatide 3 MG TABS Take 3 mg by mouth daily as needed (IBS).     Polyethyl Glycol-Propyl Glycol (SYSTANE OP) Place 1 drop into both eyes daily as needed (dry eyes).     Semaglutide,0.25 or 0.5MG /DOS, 2 MG/1.5ML SOPN Inject 0.25 mg into the skin every 7 (seven) days.     simvastatin (ZOCOR) 5 MG tablet Take 5 mg by mouth daily.     No current facility-administered medications for this visit.    PHYSICAL EXAMINATION: ECOG PERFORMANCE STATUS: 0 - Asymptomatic  Vitals:   09/26/22 1132  BP: (!) 142/52  Pulse: 74  Resp: 16  Temp: 97.9 F (36.6 C)  SpO2: 98%   Physical Exam Constitutional:      Appearance: Normal appearance.  Cardiovascular:     Rate and Rhythm: Normal rate and regular rhythm.     Pulses: Normal pulses.     Heart sounds: Murmur heard.  Musculoskeletal:        General: Swelling (Baseline 1+ lower extremity swelling) present.     Cervical back: Normal range of motion and neck supple. No rigidity.  Lymphadenopathy:     Cervical: No cervical adenopathy.  Skin:    General: Skin is warm and dry.  Neurological:     General: No focal deficit present.     Mental Status: She is alert.     Breast exam deferred, recently had breast exam by Dr.  Eliberto Ivory office LABORATORY DATA:  I have reviewed the data as listed Lab Results  Component Value Date   WBC 10.2 04/06/2018   HGB 12.2  04/06/2018   HCT 38.3 04/06/2018   MCV 88.2 04/06/2018   PLT 333 04/06/2018     Chemistry      Component Value Date/Time   NA 137 04/06/2018 1130   K 3.8 04/06/2018 1130   CL 101 04/06/2018 1130   CO2 27 04/06/2018 1130   BUN 20 04/06/2018 1130   CREATININE 0.85 04/06/2018 1130      Component Value Date/Time   CALCIUM 9.0 04/06/2018 1130     Have reviewed pertinent imaging and pathology reports  Pathology  Diagnosis 1. Breast, right, needle core biopsy, Lower outer right breast - DUCTAL CARCINOMA IN SITU WITH CALCIFICATIONS AND NECROSIS - LOBULAR NEOPLASIA (ATYPICAL LOBULAR HYPERPLASIA) - SEE COMMENT 2. Breast, right, needle core biopsy, Lower inner right breast - COMPLEX SCLEROSING LESION WITH USUAL DUCTAL HYPERPLASIA AND CALCIFICATIONS - SEE COMMENT  IMMUNOHISTOCHEMICAL AND MORPHOMETRIC ANALYSIS PERFORMED MANUALLY Estrogen Receptor: 100%, POSITIVE, STRONG STAINING INTENSITY Progesterone Receptor: 100%, POSITIVE, STRONG STAINING INTENSITY  RADIOGRAPHIC STUDIES: I have personally reviewed the radiological images as listed and agreed with the findings in the report. MM DIAG BREAST TOMO BILATERAL  Result Date: 09/21/2022 CLINICAL DATA:  Followup for history of DCIS. Patient had 2 site stereotactic biopsy of calcifications in the RIGHT breast 01/18/2021. Pathology showed intermediate grade ductal carcinoma in situ with calcifications and necrosis and atypical lobular hyperplasia in the LOWER OUTER QUADRANT (X clip which migrated 7 centimeters MEDIAL to biopsy site). Complex sclerosing lesion with usual duct hyperplasia and calcifications was identified in the LOWER INNER QUADRANT of the RIGHT breast (coil clip). Patient opted for antiestrogen therapy over surgery due to history of COPD. On 06/03/2021, patient had MR guided core biopsy of  a mass in the LOWER OUTER QUADRANT of the LEFT breast (barbell clip), showing benign concordant fibrocystic changes with metaplasia and calcifications. EXAM: DIGITAL DIAGNOSTIC BILATERAL MAMMOGRAM WITH TOMOSYNTHESIS AND CAD TECHNIQUE: Bilateral digital diagnostic mammography and breast tomosynthesis was performed. The images were evaluated with computer-aided detection. COMPARISON:  Previous exam(s). ACR Breast Density Category b: There are scattered areas of fibroglandular density. FINDINGS: RIGHT BREAST: Mammogram: Magnified views again show 12 centimeter extent of more faint calcifications in the LATERAL portion of the RIGHT breast. An area of possible distortion is confirmed on spot compression views, not associated with discrete mass. Mammographic images were processed with CAD. LEFT BREAST: Mammogram: Post biopsy change is again identified, with barbell shaped clip in the LATERAL part of the breast. The previously identified distortion is less apparent on today's study. There is no suspicious mass. Mammographic images were processed with CAD. IMPRESSION: 1. Faint linear calcifications spanning 12 centimeters in the LATERAL portion of the RIGHT breast, consistent with known ductal carcinoma in situ. 2. Possible distortion in the LATERAL aspect of the RIGHT breast, without discrete mass. 3. No suspicious changes in the LEFT breast. RECOMMENDATION: We discussed the option of stereotactic biopsy of subtle distortion in the LATERAL portion of the RIGHT breast. At this time, patient opts for close observation. Recommend RIGHT diagnostic mammogram in 6 months. I have discussed the findings and recommendations with the patient. If applicable, a reminder letter will be sent to the patient regarding the next appointment. BI-RADS CATEGORY  4: Suspicious. Electronically Signed   By: Norva Pavlov M.D.   On: 09/21/2022 14:01    All questions were answered. The patient knows to call the clinic with any problems,  questions or concerns. I spent 30 minutes in the care of this patient including H and P, review  of records, counseling and coordination of care.     Rachel Moulds, MD 09/26/2022 11:50 AM

## 2022-09-27 ENCOUNTER — Telehealth: Payer: Self-pay | Admitting: Hematology and Oncology

## 2022-09-27 DIAGNOSIS — H938X2 Other specified disorders of left ear: Secondary | ICD-10-CM | POA: Diagnosis not present

## 2022-09-27 DIAGNOSIS — I10 Essential (primary) hypertension: Secondary | ICD-10-CM | POA: Diagnosis not present

## 2022-09-27 DIAGNOSIS — Z299 Encounter for prophylactic measures, unspecified: Secondary | ICD-10-CM | POA: Diagnosis not present

## 2022-09-27 NOTE — Telephone Encounter (Signed)
Spoke with patient confirming upcoming appointments  

## 2022-09-30 ENCOUNTER — Ambulatory Visit
Admission: RE | Admit: 2022-09-30 | Discharge: 2022-09-30 | Disposition: A | Discharge status: Home / Self Care | Payer: Medicare HMO | Source: Ambulatory Visit | Attending: Hematology and Oncology | Admitting: Hematology and Oncology

## 2022-09-30 DIAGNOSIS — D0511 Intraductal carcinoma in situ of right breast: Secondary | ICD-10-CM

## 2022-09-30 DIAGNOSIS — R928 Other abnormal and inconclusive findings on diagnostic imaging of breast: Secondary | ICD-10-CM | POA: Diagnosis not present

## 2022-09-30 DIAGNOSIS — L299 Pruritus, unspecified: Secondary | ICD-10-CM | POA: Diagnosis not present

## 2022-09-30 DIAGNOSIS — I10 Essential (primary) hypertension: Secondary | ICD-10-CM | POA: Diagnosis not present

## 2022-09-30 DIAGNOSIS — Z299 Encounter for prophylactic measures, unspecified: Secondary | ICD-10-CM | POA: Diagnosis not present

## 2022-09-30 DIAGNOSIS — Z9889 Other specified postprocedural states: Secondary | ICD-10-CM | POA: Diagnosis not present

## 2022-09-30 DIAGNOSIS — H9202 Otalgia, left ear: Secondary | ICD-10-CM | POA: Diagnosis not present

## 2022-09-30 HISTORY — PX: BREAST BIOPSY: SHX20

## 2022-10-14 DIAGNOSIS — C50919 Malignant neoplasm of unspecified site of unspecified female breast: Secondary | ICD-10-CM | POA: Diagnosis not present

## 2022-10-14 DIAGNOSIS — E1122 Type 2 diabetes mellitus with diabetic chronic kidney disease: Secondary | ICD-10-CM | POA: Diagnosis not present

## 2022-10-14 DIAGNOSIS — I700 Atherosclerosis of aorta: Secondary | ICD-10-CM | POA: Diagnosis not present

## 2022-10-14 DIAGNOSIS — Z299 Encounter for prophylactic measures, unspecified: Secondary | ICD-10-CM | POA: Diagnosis not present

## 2022-10-14 DIAGNOSIS — H6120 Impacted cerumen, unspecified ear: Secondary | ICD-10-CM | POA: Diagnosis not present

## 2022-10-14 DIAGNOSIS — I10 Essential (primary) hypertension: Secondary | ICD-10-CM | POA: Diagnosis not present

## 2022-10-28 DIAGNOSIS — Z0000 Encounter for general adult medical examination without abnormal findings: Secondary | ICD-10-CM | POA: Diagnosis not present

## 2022-10-28 DIAGNOSIS — Z299 Encounter for prophylactic measures, unspecified: Secondary | ICD-10-CM | POA: Diagnosis not present

## 2022-10-28 DIAGNOSIS — R059 Cough, unspecified: Secondary | ICD-10-CM | POA: Diagnosis not present

## 2022-10-28 DIAGNOSIS — R918 Other nonspecific abnormal finding of lung field: Secondary | ICD-10-CM | POA: Diagnosis not present

## 2022-10-28 DIAGNOSIS — R5383 Other fatigue: Secondary | ICD-10-CM | POA: Diagnosis not present

## 2022-10-28 DIAGNOSIS — E7800 Pure hypercholesterolemia, unspecified: Secondary | ICD-10-CM | POA: Diagnosis not present

## 2022-10-28 DIAGNOSIS — J449 Chronic obstructive pulmonary disease, unspecified: Secondary | ICD-10-CM | POA: Diagnosis not present

## 2022-10-28 DIAGNOSIS — Z79899 Other long term (current) drug therapy: Secondary | ICD-10-CM | POA: Diagnosis not present

## 2022-10-28 DIAGNOSIS — I10 Essential (primary) hypertension: Secondary | ICD-10-CM | POA: Diagnosis not present

## 2022-11-03 ENCOUNTER — Inpatient Hospital Stay: Payer: Medicare HMO | Attending: Hematology and Oncology | Admitting: Hematology and Oncology

## 2022-11-03 DIAGNOSIS — D0511 Intraductal carcinoma in situ of right breast: Secondary | ICD-10-CM | POA: Diagnosis not present

## 2022-11-03 NOTE — Progress Notes (Signed)
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  She is doing well on anastrozole and is not inclined to proceed with surgery at this time.  Most recent right breast core biopsy showed atypical lobular hyperplasia, no evidence of invasive malignancy.  She appears to be satisfied by this finding.  Overall her calcs seem to be stable however this has not been necessarily reported in the most recent mammogram. I sent an inbasket to Dr Manson Passey to confirm stability.  She would like to continue anastrozole if possible and not to consider surgery at this time.  She will return to clinic as scheduled. All her questions were answered to the best my knowledge.   HISTORY OF PRESENTING ILLNESS:   Rebecca [name] 77 y.o. female is here because of DCIS right breast.  This is a very pleasant 77 year old female patient who was diagnosed with ductal carcinoma in situ of the right breast.  She underwent routine screening mammogram and significant calcifications were seen in her right breast.  She had 2 different biopsies on the right breast.  Right breast needle core biopsy of the lower outer quadrant showed ductal carcinoma in situ with calcifications and necrosis, lobular neoplasia.  Right breast needle core biopsy of the lower inner right breast  showed complex sclerosing lesion with usual ductal hyperplasia and calcifications.  Prognostic showed ER, 100%, positive, strong staining intensity PR, 100%, positive, strong staining intensity.  She was seen by Dr. Magnus Ivan and given the extent of disease it was recommended that she undergo breast MRIs if she desires breast conservation.  Given her medical history and COPD, she was also apparently uncertain of how she wants to proceed and whether if she wants to proceed with surgery or not.  She was referred to medical oncology and radiation oncology for further recommendations and MRI was ordered.  During her last visit we have discussed about standard of care recommendations for DCIS including lumpectomy followed by radiation and adjuvant endocrine therapy versus mastectomy followed by adjuvant endocrine therapy.  She was debating to proceed with surgery.  Since last visit she had an MRI of her breast and also made the decision not to proceed with surgery hence she is back to discuss about endocrine therapy with medical oncology.  She says she has bad hip which causes pain, this is chronic, basically arthritis related pain She has a nebulizer for her bronchitis/COPD but she doesn't use it. She smokes 1 1/2 PPD per day. She has been having constipation, has been straining to have a bowel movement, notices some blood after straining. She had a colonoscopy some time ago, may be about 3/4 yrs ago. Dad's brother had colon cancer in 57's. Her DM is uncontrolled, last Hb A 1c was 7.1 approx. HTN is moderately controlled.  Interval   She is now on endocrine therapy alone, did not want to proceed with surgery.  Since her last  visit she had a biopsy and wanted to discuss the results.  MEDICAL HISTORY:  Past Medical History:  Diagnosis Date   Breast cancer (HCC) 01/18/2021   COPD (chronic obstructive pulmonary disease) (HCC)    Depression    GERD (gastroesophageal reflux disease)    Hyperlipemia     Hypertension    Osteoporosis    Sleep apnea    has not taken test,snores, may have it    SURGICAL HISTORY: Past Surgical History:  Procedure Laterality Date   ABDOMINAL HYSTERECTOMY     BREAST BIOPSY Right 09/30/2022   MM RT BREAST BX W LOC DEV 1ST LESION IMAGE BX SPEC STEREO GUIDE 09/30/2022 [facility]   BREAST SURGERY     lt lumpectomy-not cancer   CARPAL TUNNEL RELEASE  10/26/2011   Procedure: CARPAL TUNNEL RELEASE;  Surgeon: Nicki Reaper, MD;  Location: [facility];  Service: Orthopedics;  Laterality: Left;   CARPECTOMY  10/26/2011   Procedure: CARPECTOMY;  Surgeon: Nicki Reaper, MD;  Location: [facility];  Service: Orthopedics;  Laterality: Left;  proximal row carpectomy left wrist    CATARACT EXTRACTION W/PHACO Left 04/13/2018   Procedure: CATARACT EXTRACTION PHACO AND INTRAOCULAR LENS PLACEMENT (IOC);  Surgeon: Fabio Pierce, MD;  Location: [facility];  Service: Ophthalmology;  Laterality: Left;  CDE: 14.92   CATARACT EXTRACTION W/PHACO Right 05/22/2018   Procedure: CATARACT EXTRACTION PHACO AND INTRAOCULAR LENS PLACEMENT RIGHT EYE (CDE: 16.73);  Surgeon: Fabio Pierce, MD;  Location: [facility];  Service: Ophthalmology;  Laterality: Right;   COLONOSCOPY     KNEE ARTHROSCOPY     right   TONSILLECTOMY  x2    SOCIAL HISTORY: Social History   Socioeconomic History   Marital status: Divorced    Spouse name: Not on file   Number of children: Not on file   Years of education: Not on file   Highest education level: Not on file  Occupational History   Not on file  Tobacco Use   Smoking status: Every Day    Current packs/day: 1.50    Types: Cigarettes   Smokeless tobacco: Never  Vaping Use   Vaping status: Never Used  Substance and Sexual Activity   Alcohol use: No   Drug use: Never   Sexual activity: Not on file  Other Topics Concern   Not on file  Social History Narrative   Not on file   Social Determinants of Health   Financial  Resource Strain: Not on file  Food Insecurity: Not on file  Transportation Needs: Not on file  Physical Activity: Not on file  Stress: Not on file  Social Connections: Not on file  Intimate Partner Violence: Not At Risk (02/17/2021)   Humiliation, Afraid, Rape, and Kick questionnaire    Fear of Current or Ex-Partner: No    Emotionally Abused: No    Physically Abused: No    Sexually Abused: No    FAMILY HISTORY: Family History  Problem Relation Age of Onset   Prostate cancer Father    Breast cancer Maternal Aunt    Breast cancer Cousin     ALLERGIES:  has No Known Allergies.  MEDICATIONS:  Current Outpatient Medications  Medication Sig Dispense Refill   albuterol (VENTOLIN HFA) 108 (90 Base) MCG/ACT inhaler Inhale into the lungs. (Patient not taking: Reported on 06/17/2021)     anastrozole (ARIMIDEX) 1 MG tablet Take 1 tablet (1 mg total) by mouth daily. 90 tablet 3   aspirin 81 MG chewable  tablet Chew 81 mg by mouth daily.     budesonide-formoterol (SYMBICORT) 80-4.5 MCG/ACT inhaler Inhale 2 puffs into the lungs 2 (two) times daily. (Patient not taking: Reported on 02/17/2021)     buPROPion (WELLBUTRIN SR) 100 MG 12 hr tablet Take 100 mg by mouth 2 (two) times daily.     cetirizine (ZYRTEC) 10 MG tablet Take 10 mg by mouth daily.     docusate sodium (COLACE) 100 MG capsule Take 100 mg by mouth 2 (two) times daily.     hydrochlorothiazide (HYDRODIURIL) 25 MG tablet Take 25 mg by mouth daily.     ipratropium-albuterol (DUONEB) 0.5-2.5 (3) MG/3ML SOLN Take 3 mLs by nebulization every 6 (six) hours as needed. (Patient not taking: Reported on 02/24/2021)     losartan (COZAAR) 25 MG tablet Take 25 mg by mouth daily.     metFORMIN (GLUCOPHAGE) 1000 MG tablet Take 1,000 mg by mouth 2 (two) times daily.     metoprolol succinate (TOPROL-XL) 50 MG 24 hr tablet Take 50 mg by mouth daily. Take with or immediately following a meal.     Multiple Vitamin (MULTIVITAMIN WITH MINERALS) TABS tablet  Take 1 tablet by mouth daily.     Omega-3 1000 MG CAPS Take 1,000 mg by mouth daily.     OneTouch Delica Lancets 33G MISC SMARTSIG:1 Unit(s) Topical Daily     ONETOUCH VERIO test strip 1 each daily.     Plecanatide 3 MG TABS Take 3 mg by mouth daily as needed (IBS).     Polyethyl Glycol-Propyl Glycol (SYSTANE OP) Place 1 drop into both eyes daily as needed (dry eyes).     Semaglutide,0.25 or 0.5MG /DOS, 2 MG/1.5ML SOPN Inject 0.25 mg into the skin every 7 (seven) days.     simvastatin (ZOCOR) 5 MG tablet Take 5 mg by mouth daily.     No current facility-administered medications for this visit.    PHYSICAL EXAMINATION: ECOG PERFORMANCE STATUS: 0 - Asymptomatic  There were no vitals filed for this visit.  Physical exam deferred, telephone visit  Breast exam deferred, recently had breast exam by Dr. Eliberto Ivory office LABORATORY DATA:  I have reviewed the data as listed Lab Results  Component Value Date   WBC 10.2 04/06/2018   HGB 12.2 04/06/2018   HCT 38.3 04/06/2018   MCV 88.2 04/06/2018   PLT 333 04/06/2018     Chemistry      Component Value Date/Time   NA 137 04/06/2018 1130   K 3.8 04/06/2018 1130   CL 101 04/06/2018 1130   CO2 27 04/06/2018 1130   BUN 20 04/06/2018 1130   CREATININE 0.85 04/06/2018 1130      Component Value Date/Time   CALCIUM 9.0 04/06/2018 1130     Have reviewed pertinent imaging and pathology reports  Pathology  Diagnosis 1. Breast, right, needle core biopsy, Lower outer right breast - DUCTAL CARCINOMA IN SITU WITH CALCIFICATIONS AND NECROSIS - LOBULAR NEOPLASIA (ATYPICAL LOBULAR HYPERPLASIA) - SEE COMMENT 2. Breast, right, needle core biopsy, Lower inner right breast - COMPLEX SCLEROSING LESION WITH USUAL DUCTAL HYPERPLASIA AND CALCIFICATIONS - SEE COMMENT  IMMUNOHISTOCHEMICAL AND MORPHOMETRIC ANALYSIS PERFORMED MANUALLY Estrogen Receptor: 100%, POSITIVE, STRONG STAINING INTENSITY Progesterone Receptor: 100%, POSITIVE, STRONG STAINING  INTENSITY  RADIOGRAPHIC STUDIES: I have personally reviewed the radiological images as listed and agreed with the findings in the report. No results found.  All questions were answered. The patient knows to call the clinic with any problems, questions or concerns. I spent 10  minutes in the care of this patient including H and P, review of records, counseling and coordination of care.  I connected with  Melissa Montane on 11/03/22 by a telephone application and verified that I am speaking with the correct person using two identifiers.   I discussed the limitations of evaluation and management by telemedicine. The patient expressed understanding and agreed to proceed.     Rachel Moulds, MD 11/03/2022 4:53 PM

## 2022-11-09 DIAGNOSIS — Z79899 Other long term (current) drug therapy: Secondary | ICD-10-CM | POA: Diagnosis not present

## 2022-11-09 DIAGNOSIS — E559 Vitamin D deficiency, unspecified: Secondary | ICD-10-CM | POA: Diagnosis not present

## 2022-11-09 DIAGNOSIS — R5383 Other fatigue: Secondary | ICD-10-CM | POA: Diagnosis not present

## 2022-11-09 DIAGNOSIS — E7800 Pure hypercholesterolemia, unspecified: Secondary | ICD-10-CM | POA: Diagnosis not present

## 2022-11-21 DIAGNOSIS — E1151 Type 2 diabetes mellitus with diabetic peripheral angiopathy without gangrene: Secondary | ICD-10-CM | POA: Diagnosis not present

## 2022-11-21 DIAGNOSIS — E1140 Type 2 diabetes mellitus with diabetic neuropathy, unspecified: Secondary | ICD-10-CM | POA: Diagnosis not present

## 2022-11-24 DIAGNOSIS — Z23 Encounter for immunization: Secondary | ICD-10-CM | POA: Diagnosis not present

## 2022-11-24 DIAGNOSIS — I10 Essential (primary) hypertension: Secondary | ICD-10-CM | POA: Diagnosis not present

## 2022-11-24 DIAGNOSIS — H6121 Impacted cerumen, right ear: Secondary | ICD-10-CM | POA: Diagnosis not present

## 2022-11-24 DIAGNOSIS — E1151 Type 2 diabetes mellitus with diabetic peripheral angiopathy without gangrene: Secondary | ICD-10-CM | POA: Diagnosis not present

## 2022-11-24 DIAGNOSIS — Z299 Encounter for prophylactic measures, unspecified: Secondary | ICD-10-CM | POA: Diagnosis not present

## 2022-11-24 DIAGNOSIS — E1122 Type 2 diabetes mellitus with diabetic chronic kidney disease: Secondary | ICD-10-CM | POA: Diagnosis not present

## 2022-11-24 DIAGNOSIS — E1165 Type 2 diabetes mellitus with hyperglycemia: Secondary | ICD-10-CM | POA: Diagnosis not present

## 2022-12-27 DIAGNOSIS — L570 Actinic keratosis: Secondary | ICD-10-CM | POA: Diagnosis not present

## 2022-12-27 DIAGNOSIS — L304 Erythema intertrigo: Secondary | ICD-10-CM | POA: Diagnosis not present

## 2022-12-27 DIAGNOSIS — L409 Psoriasis, unspecified: Secondary | ICD-10-CM | POA: Diagnosis not present

## 2023-01-03 ENCOUNTER — Other Ambulatory Visit: Payer: Self-pay | Admitting: Hematology and Oncology

## 2023-01-03 DIAGNOSIS — R921 Mammographic calcification found on diagnostic imaging of breast: Secondary | ICD-10-CM

## 2023-01-30 DIAGNOSIS — E1140 Type 2 diabetes mellitus with diabetic neuropathy, unspecified: Secondary | ICD-10-CM | POA: Diagnosis not present

## 2023-01-30 DIAGNOSIS — E1151 Type 2 diabetes mellitus with diabetic peripheral angiopathy without gangrene: Secondary | ICD-10-CM | POA: Diagnosis not present

## 2023-03-28 ENCOUNTER — Ambulatory Visit
Admission: RE | Admit: 2023-03-28 | Discharge: 2023-03-28 | Disposition: A | Discharge status: Home / Self Care | Payer: Medicare HMO | Source: Ambulatory Visit | Attending: Hematology and Oncology | Admitting: Hematology and Oncology

## 2023-03-28 DIAGNOSIS — D0511 Intraductal carcinoma in situ of right breast: Secondary | ICD-10-CM | POA: Diagnosis not present

## 2023-03-28 DIAGNOSIS — R921 Mammographic calcification found on diagnostic imaging of breast: Secondary | ICD-10-CM

## 2023-03-28 DIAGNOSIS — N6489 Other specified disorders of breast: Secondary | ICD-10-CM | POA: Diagnosis not present

## 2023-03-28 DIAGNOSIS — R92323 Mammographic fibroglandular density, bilateral breasts: Secondary | ICD-10-CM | POA: Diagnosis not present

## 2023-03-30 ENCOUNTER — Other Ambulatory Visit: Payer: Self-pay | Admitting: Hematology and Oncology

## 2023-03-30 ENCOUNTER — Encounter: Payer: Self-pay | Admitting: Hematology and Oncology

## 2023-03-30 ENCOUNTER — Inpatient Hospital Stay: Payer: Medicare HMO | Attending: Hematology and Oncology | Admitting: Hematology and Oncology

## 2023-03-30 VITALS — BP 156/46 | HR 72 | Temp 97.8°F | Resp 16 | Wt 211.2 lb

## 2023-03-30 DIAGNOSIS — D0511 Intraductal carcinoma in situ of right breast: Secondary | ICD-10-CM | POA: Insufficient documentation

## 2023-03-30 DIAGNOSIS — F17210 Nicotine dependence, cigarettes, uncomplicated: Secondary | ICD-10-CM | POA: Insufficient documentation

## 2023-03-30 DIAGNOSIS — Z8042 Family history of malignant neoplasm of prostate: Secondary | ICD-10-CM | POA: Insufficient documentation

## 2023-03-30 DIAGNOSIS — Z79811 Long term (current) use of aromatase inhibitors: Secondary | ICD-10-CM | POA: Diagnosis not present

## 2023-03-30 DIAGNOSIS — Z170 Estrogen receptor positive status [ER+]: Secondary | ICD-10-CM | POA: Insufficient documentation

## 2023-03-30 DIAGNOSIS — M8580 Other specified disorders of bone density and structure, unspecified site: Secondary | ICD-10-CM | POA: Diagnosis not present

## 2023-03-30 DIAGNOSIS — R921 Mammographic calcification found on diagnostic imaging of breast: Secondary | ICD-10-CM

## 2023-03-30 DIAGNOSIS — Z1721 Progesterone receptor positive status: Secondary | ICD-10-CM | POA: Diagnosis not present

## 2023-03-30 DIAGNOSIS — Z803 Family history of malignant neoplasm of breast: Secondary | ICD-10-CM | POA: Diagnosis not present

## 2023-03-30 DIAGNOSIS — L600 Ingrowing nail: Secondary | ICD-10-CM | POA: Diagnosis not present

## 2023-03-30 DIAGNOSIS — Z90710 Acquired absence of both cervix and uterus: Secondary | ICD-10-CM | POA: Insufficient documentation

## 2023-03-30 NOTE — Progress Notes (Signed)
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 Recent mammogram showed no change. Discussed the slow progression of DCIS and the potential for it to disappear over time. -Continue Anastrozole. -Schedule follow-up mammogram in 6 months (July 2025).  Osteopenia Last bone density scan was in June 2022. Discussed the need for regular monitoring and potential treatment if condition worsens. She wanted to have this done with her PCP -Contact primary care physician to schedule a bone density scan. -Request a copy of the report to be sent to our office.  Respiratory Reports labored breathing and uses an inhaler as needed. -Continue using inhaler as needed.  Ingrown Toenail Managed by podiatrist. -Continue current treatment as directed by podiatrist.  Follow-up in 6 months or sooner if needed.  All her questions were answered to the best my knowledge.   HISTORY OF PRESENTING ILLNESS:   Rebecca [name] 78 y.o. female is here because of DCIS right breast.  This is a very pleasant 78 year old female patient who was diagnosed with ductal carcinoma in situ of the right breast.  She underwent routine screening mammogram and significant calcifications were seen in her right breast.  She had 2 different  biopsies on the right breast.  Right breast needle core biopsy of the lower outer quadrant showed ductal carcinoma in situ with calcifications and necrosis, lobular neoplasia.  Right breast needle core biopsy of the lower inner right breast showed complex sclerosing lesion with usual ductal hyperplasia and calcifications.  Prognostic showed ER, 100%, positive, strong staining intensity PR, 100%, positive, strong staining intensity.  She was seen by Dr. Magnus Ivan and given the extent of disease it was recommended that she undergo breast MRIs if she desires breast conservation.  Given her medical history and COPD, she was also apparently uncertain of how she wants to proceed and whether if she wants to proceed with surgery or not.  She was referred to medical oncology and radiation oncology for further recommendations and MRI was ordered.  During her last visit we have discussed about standard of care recommendations for DCIS including lumpectomy followed by radiation and adjuvant endocrine therapy versus mastectomy followed by adjuvant endocrine therapy.  She was debating to proceed with surgery.  Since last visit she had an MRI of her breast and also made the decision not to proceed with surgery hence she is back to discuss about endocrine therapy with medical oncology.  She says she has bad hip which causes pain, this is chronic, basically arthritis related pain She has a nebulizer for her bronchitis/COPD but she doesn't use it. She smokes 1 1/2 PPD per day. She has been having constipation, has been straining to have a bowel movement, notices some blood after straining. She had a colonoscopy some time ago, may be about 3/4 yrs ago. Dad's brother had colon cancer in 68's. Her DM is uncontrolled, last Hb A 1c  was 7.1 approx. HTN is moderately controlled.  Discussed the use of AI scribe software for clinical note transcription with the patient, who gave verbal consent to proceed.  History of Present  Illness    The patient, with a history of ductal carcinoma in situ (DCIS) and osteopenia, presents for a routine follow-up. She reports adherence to anastrozole and use of an inhaler as needed. She recently had a mammogram, which showed no change in the DCIS. She expresses understanding that DCIS may not shrink quickly and is content with the stability of the condition.  She also reports occasional trouble with an ingrown toenail and dry feet, which are being managed by a podiatrist. She has labored breathing with exertion but is otherwise doing well. She has a history of osteopenia and is due for a bone density scan, which she plans to schedule with her primary care provider. She also reports trouble with her right hip. Rest of the pertinent 10 point ROS reviewed and negative  MEDICAL HISTORY:  Past Medical History:  Diagnosis Date   Breast cancer (HCC) 01/18/2021   COPD (chronic obstructive pulmonary disease) (HCC)    Depression    GERD (gastroesophageal reflux disease)    Hyperlipemia    Hypertension    Osteoporosis    Sleep apnea    has not taken test,snores, may have it    SURGICAL HISTORY: Past Surgical History:  Procedure Laterality Date   ABDOMINAL HYSTERECTOMY     BREAST BIOPSY Right 09/30/2022   MM RT BREAST BX W LOC DEV 1ST LESION IMAGE BX SPEC STEREO GUIDE 09/30/2022 [facility]   BREAST SURGERY     lt lumpectomy-not cancer   CARPAL TUNNEL RELEASE  10/26/2011   Procedure: CARPAL TUNNEL RELEASE;  Surgeon: Nicki Reaper, MD;  Location: [facility];  Service: Orthopedics;  Laterality: Left;   CARPECTOMY  10/26/2011   Procedure: CARPECTOMY;  Surgeon: Nicki Reaper, MD;  Location: [facility];  Service: Orthopedics;  Laterality: Left;  proximal row carpectomy left wrist    CATARACT EXTRACTION W/PHACO Left 04/13/2018   Procedure: CATARACT EXTRACTION PHACO AND INTRAOCULAR LENS PLACEMENT (IOC);  Surgeon: Fabio Pierce, MD;  Location: [facility];  Service:  Ophthalmology;  Laterality: Left;  CDE: 14.92   CATARACT EXTRACTION W/PHACO Right 05/22/2018   Procedure: CATARACT EXTRACTION PHACO AND INTRAOCULAR LENS PLACEMENT RIGHT EYE (CDE: 16.73);  Surgeon: Fabio Pierce, MD;  Location: [facility];  Service: Ophthalmology;  Laterality: Right;   COLONOSCOPY     KNEE ARTHROSCOPY     right   TONSILLECTOMY  x2    SOCIAL HISTORY: Social History   Socioeconomic History   Marital status: Divorced    Spouse name: Not on file   Number of children: Not on file   Years of education: Not on file   Highest education level: Not on file  Occupational History   Not on file  Tobacco Use   Smoking status: Every Day    Current packs/day: 1.50    Types: Cigarettes   Smokeless tobacco: Never  Vaping Use   Vaping status: Never Used  Substance and Sexual Activity   Alcohol use: No   Drug use: Never   Sexual activity: Not on file  Other Topics Concern   Not on file  Social History Narrative   Not on file   Social Drivers of Health   Financial Resource Strain: Not on file  Food Insecurity: Not on file  Transportation Needs: Not on file  Physical Activity: Not on file  Stress: Not on file  Social Connections: Not on file  Intimate Partner Violence: Not At Risk (02/17/2021)   Humiliation, Afraid, Rape, and Kick questionnaire    Fear of Current or Ex-Partner: No    Emotionally Abused: No    Physically Abused: No    Sexually Abused: No    FAMILY HISTORY: Family History  Problem Relation Age of Onset   Prostate cancer Father    Breast cancer Maternal Aunt    Breast cancer Cousin     ALLERGIES:  has no known allergies.  MEDICATIONS:  Current Outpatient Medications  Medication Sig Dispense Refill   albuterol (VENTOLIN HFA) 108 (90 Base) MCG/ACT inhaler Inhale into the lungs. (Patient not taking: Reported on 06/17/2021)     anastrozole (ARIMIDEX) 1 MG tablet Take 1 tablet (1 mg total) by mouth daily. 90 tablet 3   aspirin 81 MG chewable tablet  Chew 81 mg by mouth daily.     budesonide-formoterol (SYMBICORT) 80-4.5 MCG/ACT inhaler Inhale 2 puffs into the lungs 2 (two) times daily. (Patient not taking: Reported on 02/17/2021)     buPROPion (WELLBUTRIN SR) 100 MG 12 hr tablet Take 100 mg by mouth 2 (two) times daily.     cetirizine (ZYRTEC) 10 MG tablet Take 10 mg by mouth daily.     docusate sodium (COLACE) 100 MG capsule Take 100 mg by mouth 2 (two) times daily.     hydrochlorothiazide (HYDRODIURIL) 25 MG tablet Take 25 mg by mouth daily.     ipratropium-albuterol (DUONEB) 0.5-2.5 (3) MG/3ML SOLN Take 3 mLs by nebulization every 6 (six) hours as needed. (Patient not taking: Reported on 02/24/2021)     losartan (COZAAR) 25 MG tablet Take 25 mg by mouth daily.     metFORMIN (GLUCOPHAGE) 1000 MG tablet Take 1,000 mg by mouth 2 (two) times daily.     metoprolol succinate (TOPROL-XL) 50 MG 24 hr tablet Take 50 mg by mouth daily. Take with or immediately following a meal.     Multiple Vitamin (MULTIVITAMIN WITH MINERALS) TABS tablet Take 1 tablet by mouth daily.     Omega-3 1000 MG CAPS Take 1,000 mg by mouth daily.     OneTouch Delica Lancets 33G MISC SMARTSIG:1 Unit(s) Topical Daily     ONETOUCH VERIO test strip 1 each daily.     Plecanatide 3 MG TABS Take 3 mg by mouth daily as needed (IBS).     Polyethyl Glycol-Propyl Glycol (SYSTANE OP) Place 1 drop into both eyes daily as needed (dry eyes).     Semaglutide,0.25 or 0.5MG /DOS, 2 MG/1.5ML SOPN Inject 0.25 mg into the skin every 7 (seven) days.     simvastatin (ZOCOR) 5 MG tablet Take 5 mg by mouth daily.     No current facility-administered medications for this visit.    PHYSICAL EXAMINATION: ECOG PERFORMANCE STATUS: 0 - Asymptomatic  Vitals:   03/30/23 1149  BP: (!) 156/46  Pulse: 72  Resp: 16  Temp: 97.8 F (36.6 C)  SpO2: 96%     General Appearance: Alert oriented and in no acute distress Bilateral breast inspected, large and pendulous.  No palpable masses.  No regional  adenopathy.  LABORATORY DATA:  I have reviewed the data as listed Lab Results  Component Value Date   WBC 10.2 04/06/2018   HGB 12.2 04/06/2018   HCT 38.3 04/06/2018   MCV 88.2 04/06/2018   PLT 333 04/06/2018     Chemistry  Component Value Date/Time   NA 137 04/06/2018 1130   K 3.8 04/06/2018 1130   CL 101 04/06/2018 1130   CO2 27 04/06/2018 1130   BUN 20 04/06/2018 1130   CREATININE 0.85 04/06/2018 1130      Component Value Date/Time   CALCIUM 9.0 04/06/2018 1130     Have reviewed pertinent imaging and pathology reports  Pathology  Diagnosis 1. Breast, right, needle core biopsy, Lower outer right breast - DUCTAL CARCINOMA IN SITU WITH CALCIFICATIONS AND NECROSIS - LOBULAR NEOPLASIA (ATYPICAL LOBULAR HYPERPLASIA) - SEE COMMENT 2. Breast, right, needle core biopsy, Lower inner right breast - COMPLEX SCLEROSING LESION WITH USUAL DUCTAL HYPERPLASIA AND CALCIFICATIONS - SEE COMMENT  IMMUNOHISTOCHEMICAL AND MORPHOMETRIC ANALYSIS PERFORMED MANUALLY Estrogen Receptor: 100%, POSITIVE, STRONG STAINING INTENSITY Progesterone Receptor: 100%, POSITIVE, STRONG STAINING INTENSITY  RADIOGRAPHIC STUDIES: I have personally reviewed the radiological images as listed and agreed with the findings in the report. MM 3D DIAGNOSTIC MAMMOGRAM BILATERAL BREAST Result Date: 03/28/2023 CLINICAL DATA:  Short-term follow-up for right breast DCIS. Patient had a 2 site stereotactic core needle biopsy of right breast calcifications on 01/18/2021, ribbon an X clip. Biopsies demonstrated intermediate grade DCIS and atypical lobular hyperplasia. One of the post biopsy marker clips, the X shaped clip, is displaced 7 cm medial. Patient also has a history of a right breast biopsy revealing a complex sclerosing lesion, lower inner quadrant, marked with a coil clip. In addition patient has had a left breast MRI guided core needle biopsy revealing fibrocystic changes without malignancy or atypia, dumbbell  clip. Due to significant COPD, surgery has not performed. Patient is undergoing antiestrogen therapy. EXAM: DIGITAL DIAGNOSTIC BILATERAL MAMMOGRAM WITH TOMOSYNTHESIS AND CAD TECHNIQUE: Bilateral digital diagnostic mammography and breast tomosynthesis was performed. The images were evaluated with computer-aided detection. COMPARISON:  Previous exam(s). ACR Breast Density Category b: There are scattered areas of fibroglandular density. FINDINGS: Minimal residual calcifications in the upper outer right breast near the ribbon shaped post biopsy clip. These are stable from the most recent prior study. There are no breast masses, areas of significant asymmetry, areas of new or suspicious architectural distortion or new calcifications. IMPRESSION: 1. Known area intermediate grade DCIS lateral right breast. No evidence of disease progression with patient on antiestrogen therapy. 2. No other evidence of breast malignancy. Other previously biopsied high risk lesions also stable. RECOMMENDATION: Diagnostic right breast mammography in 6 months with magnification views. I have discussed the findings and recommendations with the patient. If applicable, a reminder letter will be sent to the patient regarding the next appointment. BI-RADS CATEGORY  6: Known biopsy-proven malignancy. Electronically Signed   By: Amie Portland M.D.   On: 03/28/2023 12:02    All questions were answered. The patient knows to call the clinic with any problems, questions or concerns.      Rachel Moulds, MD 03/30/2023 12:01 PM

## 2023-03-31 ENCOUNTER — Telehealth: Payer: Self-pay | Admitting: Hematology and Oncology

## 2023-03-31 NOTE — Telephone Encounter (Signed)
Spoke with patient to get scheduled she mentioned she would call back after scheduling mammogram

## 2023-04-10 DIAGNOSIS — E1140 Type 2 diabetes mellitus with diabetic neuropathy, unspecified: Secondary | ICD-10-CM | POA: Diagnosis not present

## 2023-04-10 DIAGNOSIS — E1151 Type 2 diabetes mellitus with diabetic peripheral angiopathy without gangrene: Secondary | ICD-10-CM | POA: Diagnosis not present

## 2023-04-17 DIAGNOSIS — E2839 Other primary ovarian failure: Secondary | ICD-10-CM | POA: Diagnosis not present

## 2023-05-04 DIAGNOSIS — E119 Type 2 diabetes mellitus without complications: Secondary | ICD-10-CM | POA: Diagnosis not present

## 2023-06-15 ENCOUNTER — Other Ambulatory Visit: Payer: Self-pay | Admitting: *Deleted

## 2023-06-15 DIAGNOSIS — D0511 Intraductal carcinoma in situ of right breast: Secondary | ICD-10-CM

## 2023-06-15 MED ORDER — ANASTROZOLE 1 MG PO TABS: 1.0000 mg | ORAL_TABLET | Freq: Every day | ORAL | 3 refills | Status: AC

## 2023-06-26 DIAGNOSIS — E1140 Type 2 diabetes mellitus with diabetic neuropathy, unspecified: Secondary | ICD-10-CM | POA: Diagnosis not present

## 2023-06-26 DIAGNOSIS — E1151 Type 2 diabetes mellitus with diabetic peripheral angiopathy without gangrene: Secondary | ICD-10-CM | POA: Diagnosis not present

## 2023-07-25 DIAGNOSIS — E7800 Pure hypercholesterolemia, unspecified: Secondary | ICD-10-CM | POA: Diagnosis not present

## 2023-07-25 DIAGNOSIS — Z79899 Other long term (current) drug therapy: Secondary | ICD-10-CM | POA: Diagnosis not present

## 2023-07-25 DIAGNOSIS — R5383 Other fatigue: Secondary | ICD-10-CM | POA: Diagnosis not present

## 2023-07-25 DIAGNOSIS — E559 Vitamin D deficiency, unspecified: Secondary | ICD-10-CM | POA: Diagnosis not present

## 2023-09-11 DIAGNOSIS — E1151 Type 2 diabetes mellitus with diabetic peripheral angiopathy without gangrene: Secondary | ICD-10-CM | POA: Diagnosis not present

## 2023-09-11 DIAGNOSIS — E1140 Type 2 diabetes mellitus with diabetic neuropathy, unspecified: Secondary | ICD-10-CM | POA: Diagnosis not present

## 2023-09-27 ENCOUNTER — Other Ambulatory Visit: Payer: Self-pay | Admitting: Hematology and Oncology

## 2023-09-27 ENCOUNTER — Ambulatory Visit
Admission: RE | Admit: 2023-09-27 | Discharge: 2023-09-27 | Disposition: A | Discharge status: Home / Self Care | Payer: Medicare HMO | Source: Ambulatory Visit | Attending: Hematology and Oncology | Admitting: Hematology and Oncology

## 2023-09-27 ENCOUNTER — Ambulatory Visit
Admission: RE | Admit: 2023-09-27 | Discharge: 2023-09-27 | Disposition: A | Discharge status: Home / Self Care | Source: Ambulatory Visit | Attending: Hematology and Oncology | Admitting: Hematology and Oncology

## 2023-09-27 DIAGNOSIS — N6341 Unspecified lump in right breast, subareolar: Secondary | ICD-10-CM | POA: Diagnosis not present

## 2023-09-27 DIAGNOSIS — N6310 Unspecified lump in the right breast, unspecified quadrant: Secondary | ICD-10-CM

## 2023-09-27 DIAGNOSIS — R921 Mammographic calcification found on diagnostic imaging of breast: Secondary | ICD-10-CM

## 2023-09-29 ENCOUNTER — Telehealth: Payer: Self-pay | Admitting: Hematology and Oncology

## 2023-09-29 NOTE — Telephone Encounter (Signed)
 Spoke with patient confirming upcoming appointment changes

## 2023-10-05 ENCOUNTER — Ambulatory Visit: Payer: Medicare HMO | Admitting: Hematology and Oncology

## 2023-10-13 ENCOUNTER — Ambulatory Visit
Admission: RE | Admit: 2023-10-13 | Discharge: 2023-10-13 | Disposition: A | Discharge status: Home / Self Care | Source: Ambulatory Visit | Attending: Hematology and Oncology | Admitting: Hematology and Oncology

## 2023-10-13 DIAGNOSIS — N6310 Unspecified lump in the right breast, unspecified quadrant: Secondary | ICD-10-CM

## 2023-10-13 DIAGNOSIS — N6341 Unspecified lump in right breast, subareolar: Secondary | ICD-10-CM | POA: Diagnosis not present

## 2023-10-13 DIAGNOSIS — N6031 Fibrosclerosis of right breast: Secondary | ICD-10-CM | POA: Diagnosis not present

## 2023-10-13 HISTORY — PX: BREAST BIOPSY: SHX20

## 2023-10-16 ENCOUNTER — Ambulatory Visit: Payer: Self-pay | Admitting: Hematology and Oncology

## 2023-10-16 LAB — SURGICAL PATHOLOGY

## 2023-10-16 NOTE — Telephone Encounter (Addendum)
 Pt per MD review of pathology ----- Message from [facility] sent at 10/16/2023  2:54 PM EDT ----- Ms Recendez's path report is benign. Can u let her know ----- Message ----- From: Interface, Lab In Three Zero One Sent: 10/16/2023  10:30 AM EDT To: Amber Stalls, MD

## 2023-10-20 ENCOUNTER — Inpatient Hospital Stay: Attending: Hematology and Oncology | Admitting: Hematology and Oncology

## 2023-10-20 VITALS — BP 130/80 | HR 88 | Temp 97.5°F | Resp 16 | Wt 202.7 lb

## 2023-10-20 DIAGNOSIS — K5900 Constipation, unspecified: Secondary | ICD-10-CM | POA: Diagnosis not present

## 2023-10-20 DIAGNOSIS — Z800 Family history of malignant neoplasm of digestive organs: Secondary | ICD-10-CM | POA: Insufficient documentation

## 2023-10-20 DIAGNOSIS — Z170 Estrogen receptor positive status [ER+]: Secondary | ICD-10-CM | POA: Diagnosis not present

## 2023-10-20 DIAGNOSIS — Z79811 Long term (current) use of aromatase inhibitors: Secondary | ICD-10-CM | POA: Insufficient documentation

## 2023-10-20 DIAGNOSIS — Z8042 Family history of malignant neoplasm of prostate: Secondary | ICD-10-CM | POA: Diagnosis not present

## 2023-10-20 DIAGNOSIS — Z1721 Progesterone receptor positive status: Secondary | ICD-10-CM | POA: Insufficient documentation

## 2023-10-20 DIAGNOSIS — M8580 Other specified disorders of bone density and structure, unspecified site: Secondary | ICD-10-CM | POA: Diagnosis not present

## 2023-10-20 DIAGNOSIS — N951 Menopausal and female climacteric states: Secondary | ICD-10-CM | POA: Diagnosis not present

## 2023-10-20 DIAGNOSIS — D0511 Intraductal carcinoma in situ of right breast: Secondary | ICD-10-CM | POA: Diagnosis not present

## 2023-10-20 DIAGNOSIS — Z803 Family history of malignant neoplasm of breast: Secondary | ICD-10-CM | POA: Insufficient documentation

## 2023-10-20 DIAGNOSIS — F17210 Nicotine dependence, cigarettes, uncomplicated: Secondary | ICD-10-CM | POA: Insufficient documentation

## 2023-10-20 DIAGNOSIS — J449 Chronic obstructive pulmonary disease, unspecified: Secondary | ICD-10-CM | POA: Diagnosis not present

## 2023-10-20 NOTE — Progress Notes (Signed)
 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 Assessment & Plan  Ductal carcinoma in situ (DCIS) of right breast, stable - Continue anastrozole  therapy. - Recent biopsy with ALH, likely not a new growth. - I think she can continue anastrozole  and be followed with every mammogram and US . - Perform mammogram every six months.  COPD (chronic obstructive pulmonary disease) Symptoms may be exacerbated by ongoing smoking, contributing to lightheadedness and sleep disturbances.  Constipation Managed with stool softeners and occasional use of Linzess or liquid Dulcolax as needed.  Osteopenia Managed with calcium and vitamin D supplementation to support bone density. - Continue calcium with vitamin D supplementation.  Postmenopausal hot flashes (vasomotor symptoms) Hot flashes likely related to anastrozole  therapy.   Nicotine dependence, cigarettes Continues to smoke, potentially exacerbating COPD symptoms and affecting sleep quality.  All her questions were answered to the best my knowledge.   HISTORY OF PRESENTING ILLNESS:   GENEVE KIMPEL 78 y.o. female is here because of DCIS right breast.  This is a very pleasant 78 year old female patient who was diagnosed with ductal carcinoma in situ of the right breast.  She underwent routine screening  mammogram and significant calcifications were seen in her right breast.  She had 2 different biopsies on the right breast.  Right breast needle core biopsy of the lower outer quadrant showed ductal carcinoma in situ with calcifications and necrosis, lobular neoplasia.  Right breast needle core biopsy of the lower inner right breast showed complex sclerosing lesion with usual ductal hyperplasia and calcifications.  Prognostic showed ER, 100%, positive, strong staining intensity PR, 100%, positive, strong staining intensity.  She was seen by Dr. Vernetta and given the extent of disease it was recommended that she undergo breast MRIs if she desires breast conservation.  Given her medical history and COPD, she was also apparently uncertain of how she wants to proceed and whether if she wants to proceed with surgery or not.  She was referred to medical oncology and radiation oncology for further recommendations and MRI was ordered.  During her initial visit we have discussed about standard of care recommendations for DCIS including lumpectomy followed by radiation and adjuvant endocrine therapy versus mastectomy followed by adjuvant endocrine therapy.  She decided to proceed with endocrine therapy alone. She says she has bad hip which causes pain, this is chronic, basically arthritis related pain She has a nebulizer for her bronchitis/COPD but she doesn't use it. She smokes 1 1/2 PPD per day. She has been having constipation, has been straining to have a bowel movement, notices some blood after straining. She had a colonoscopy some time ago, may be about 3/4 yrs ago. Dad's brother had colon cancer in 107's. Her DM is uncontrolled, last Hb A 1c was 7.1 approx. HTN is moderately controlled.  Discussed the use of AI scribe software for clinical note transcription with the patient, who gave verbal consent to proceed.  History of  Present Illness MIKENZIE MCCANNON is a 78 year old female with atypical lobular  hyperplasia and DCIS who presents for follow-up after a recent biopsy.  She recently underwent another biopsy, which revealed a small area of atypical lobular hyperplasia but no cancer or precancer. She has been taking anastrozole  since 2022.  She experiences significant sweating and hot flashes, particularly when engaging in activities, which she attributes to anastrozole . She also reports occasional lightheadedness, which she suspects may be related to her COPD or smoking habits.  Her sleep is disrupted, with only two to three hours of sleep at a time, which she believes may be exacerbated by smoking. She continues to smoke, possibly more due to her sleep issues.  She has constipation, requiring two stool softeners daily and occasionally Linzess or liquid Dulcolax. She takes calcium with vitamin D.    MEDICAL HISTORY:  Past Medical History:  Diagnosis Date   Breast cancer (HCC) 01/18/2021   COPD (chronic obstructive pulmonary disease) (HCC)    Depression    GERD (gastroesophageal reflux disease)    Hyperlipemia    Hypertension    Osteoporosis    Sleep apnea    has not taken test,snores, may have it    SURGICAL HISTORY: Past Surgical History:  Procedure Laterality Date   ABDOMINAL HYSTERECTOMY     BREAST BIOPSY Right 09/30/2022   MM RT BREAST BX W LOC DEV 1ST LESION IMAGE BX SPEC STEREO GUIDE 09/30/2022 [facility]   BREAST BIOPSY Right 10/13/2023   US  RT BREAST BX W LOC DEV 1ST LESION IMG BX SPEC US  GUIDE 10/13/2023 [facility]   BREAST SURGERY     lt lumpectomy-not cancer   CARPAL TUNNEL RELEASE  10/26/2011   Procedure: CARPAL TUNNEL RELEASE;  Surgeon: Arley JONELLE Curia, MD;  Location: [facility];  Service: Orthopedics;  Laterality: Left;   CARPECTOMY  10/26/2011   Procedure: CARPECTOMY;  Surgeon: Arley JONELLE Curia, MD;  Location: [facility];  Service: Orthopedics;  Laterality: Left;  proximal row carpectomy left wrist    CATARACT EXTRACTION  W/PHACO Left 04/13/2018   Procedure: CATARACT EXTRACTION PHACO AND INTRAOCULAR LENS PLACEMENT (IOC);  Surgeon: Harrie Agent, MD;  Location: [facility];  Service: Ophthalmology;  Laterality: Left;  CDE: 14.92   CATARACT EXTRACTION W/PHACO Right 05/22/2018   Procedure: CATARACT EXTRACTION PHACO AND INTRAOCULAR LENS PLACEMENT RIGHT EYE (CDE: 16.73);  Surgeon: Harrie Agent, MD;  Location: [facility];  Service: Ophthalmology;  Laterality: Right;   COLONOSCOPY     KNEE ARTHROSCOPY     right   TONSILLECTOMY  x2    SOCIAL HISTORY: Social History   Socioeconomic History   Marital status: Divorced    Spouse name: Not on file   Number of children: Not on file   Years of education: Not on file   Highest education level: Not on file  Occupational History   Not on file  Tobacco Use   Smoking status: Every Day    Current packs/day: 1.50    Types: Cigarettes   Smokeless tobacco: Never  Vaping Use   Vaping status: Never Used  Substance and Sexual Activity   Alcohol  use: No   Drug use: Never   Sexual activity: Not on file  Other Topics Concern   Not on file  Social History Narrative   Not on file   Social Drivers of Health   Financial Resource Strain: Not on file  Food Insecurity: Not on file  Transportation Needs: Not on file  Physical Activity: Not on file  Stress: Not on file  Social Connections: Not on file  Intimate Partner Violence: Not At Risk (02/17/2021)   Humiliation, Afraid, Rape, and Kick questionnaire    Fear of Current or Ex-Partner: No    Emotionally Abused: No    Physically Abused: No    Sexually Abused: No    FAMILY HISTORY: Family History  Problem Relation Age of Onset   Prostate cancer Father    Breast cancer Maternal Aunt    Breast cancer Cousin     ALLERGIES:  has no known allergies.  MEDICATIONS:  Current Outpatient Medications  Medication Sig Dispense Refill   anastrozole  (ARIMIDEX ) 1 MG tablet Take 1 tablet (1 mg total) by mouth daily. 90 tablet 3    aspirin 81 MG chewable tablet Chew 81 mg by mouth daily.     buPROPion (WELLBUTRIN SR) 100 MG 12 hr tablet Take 100 mg by mouth 2 (two) times daily.     cetirizine (ZYRTEC) 10 MG tablet Take 10 mg by mouth daily.     docusate sodium (COLACE) 100 MG capsule Take 100 mg by mouth 2 (two) times daily.     hydrochlorothiazide (HYDRODIURIL) 25 MG tablet Take 25 mg by mouth daily.     losartan (COZAAR) 25 MG tablet Take 25 mg by mouth daily.     metFORMIN (GLUCOPHAGE) 1000 MG tablet Take 1,000 mg by mouth 2 (two) times daily.     metoprolol succinate (TOPROL-XL) 50 MG 24 hr tablet Take 50 mg by mouth daily. Take with or immediately following a meal.     Multiple Vitamin (MULTIVITAMIN WITH MINERALS) TABS tablet Take 1 tablet by mouth daily.     Omega-3 1000 MG CAPS Take 1,000 mg by mouth daily.     OneTouch Delica Lancets 33G MISC SMARTSIG:1 Unit(s) Topical Daily     ONETOUCH VERIO test strip 1 each daily.     Plecanatide 3 MG TABS Take 3 mg by mouth daily as needed (IBS).     Polyethyl Glycol-Propyl Glycol (SYSTANE OP) Place 1 drop into both eyes daily as needed (dry eyes).     Semaglutide,0.25 or 0.5MG /DOS, 2 MG/1.5ML SOPN Inject 0.25 mg into the skin every 7 (seven) days.     simvastatin (ZOCOR) 5 MG tablet Take 5 mg by mouth daily.     albuterol (VENTOLIN HFA) 108 (90 Base) MCG/ACT inhaler Inhale into the lungs. (Patient not taking: Reported on 10/20/2023)     budesonide-formoterol (SYMBICORT) 80-4.5 MCG/ACT inhaler Inhale 2 puffs into the lungs 2 (two) times daily. (Patient not taking: Reported on 10/20/2023)     ipratropium-albuterol (DUONEB) 0.5-2.5 (3) MG/3ML SOLN Take 3 mLs by nebulization every 6 (six) hours as needed. (Patient not taking: Reported on 10/20/2023)     No current facility-administered medications for this visit.    PHYSICAL EXAMINATION: ECOG PERFORMANCE STATUS: 0 - Asymptomatic  Vitals:   10/20/23 1212  BP: 130/80  Pulse: 88  Resp: 16  Temp: (!) 97.5 F (36.4 C)  SpO2:  97%     General Appearance: Alert oriented and in no acute distress Bilateral breast inspected, large and pendulous.  No palpable masses.  No regional adenopathy.  LABORATORY DATA:  I have reviewed the data as listed Lab Results  Component Value Date   WBC 10.2 04/06/2018   HGB 12.2 04/06/2018   HCT 38.3 04/06/2018   MCV 88.2 04/06/2018   PLT 333 04/06/2018     Chemistry  Component Value Date/Time   NA 137 04/06/2018 1130   K 3.8 04/06/2018 1130   CL 101 04/06/2018 1130   CO2 27 04/06/2018 1130   BUN 20 04/06/2018 1130   CREATININE 0.85 04/06/2018 1130      Component Value Date/Time   CALCIUM 9.0 04/06/2018 1130     Have reviewed pertinent imaging and pathology reports  Pathology  Diagnosis 1. Breast, right, needle core biopsy, Lower outer right breast - DUCTAL CARCINOMA IN SITU WITH CALCIFICATIONS AND NECROSIS - LOBULAR NEOPLASIA (ATYPICAL LOBULAR HYPERPLASIA) - SEE COMMENT 2. Breast, right, needle core biopsy, Lower inner right breast - COMPLEX SCLEROSING LESION WITH USUAL DUCTAL HYPERPLASIA AND CALCIFICATIONS - SEE COMMENT  IMMUNOHISTOCHEMICAL AND MORPHOMETRIC ANALYSIS PERFORMED MANUALLY Estrogen Receptor: 100%, POSITIVE, STRONG STAINING INTENSITY Progesterone Receptor: 100%, POSITIVE, STRONG STAINING INTENSITY  RADIOGRAPHIC STUDIES: I have personally reviewed the radiological images as listed and agreed with the findings in the report. US  RT BREAST BX W LOC DEV 1ST LESION IMG BX SPEC US  GUIDE Addendum Date: 10/17/2023 ADDENDUM REPORT: 10/17/2023 11:33 ADDENDUM: PATHOLOGY revealed: 1. Breast, right, needle core biopsy, 1.0 cm, retroareolar 12:00, clip heart : - BENIGN BREAST SHOWING EXTENSIVE STROMAL FIBROSIS - VERY FOCAL ATYPICAL LOBULAR HYPERPLASIA (ALH) - NEGATIVE FOR MICROCALCIFICATIONS - NEGATIVE FOR CARCINOMA. Pathology results are CONCORDANT with imaging findings, per Aliene Lloyd M.D. Pathology results and recommendations were discussed with patient  via telephone on 10/16/2023 by Rock Hover RN. Patient reported biopsy site doing well with no adverse symptoms, and slight tenderness at the site. Post biopsy care instructions were reviewed, questions were answered and my direct phone number was provided. Patient was instructed to call [facility] for any additional questions or concerns related to biopsy site. Recommendation: 1. Patient instructed to follow-up with provider (oncologist - Larie Mathes) for management of ATYPICAL LOBULAR HYPERPLASIA (ALH). Rock Hover RN sent Epic message to oncologist/provider with biopsy results and request for follow-up; receipt of message acknowledged by physician. Pathology results reported by Rock Hover RN on 10/17/2023. Electronically Signed   By: Aliene Lloyd M.D.   On: 10/17/2023 11:33   Result Date: 10/17/2023 CLINICAL DATA:  78 year old woman with prior history of RIGHT breast DCIS, CSL, and ALH presents for ultrasound-guided core needle biopsy of new RIGHT retroareolar breast mass. Patient is not a surgical candidate due to COPD. EXAM: ULTRASOUND GUIDED RIGHT BREAST CORE NEEDLE BIOPSY COMPARISON:  Previous exam(s). PROCEDURE: I met with the patient and we discussed the procedure of ultrasound-guided biopsy, including benefits and alternatives. We discussed the high likelihood of a successful procedure. We discussed the risks of the procedure, including infection, bleeding, tissue injury, clip migration, and inadequate sampling. Informed written consent was given. The usual time-out protocol was performed immediately prior to the procedure. Lesion quadrant: Upper inner quadrant Using sterile technique and 1% Lidocaine  as local anesthetic, under direct ultrasound visualization, a 14 gauge spring-loaded device was used to perform biopsy of the 1.0 cm mass at 12 o'clock retroareolar using a lateral approach. At the conclusion of the procedure heart shaped tissue marker clip was deployed into the  biopsy cavity. Follow up 2 view mammogram was performed and dictated separately. IMPRESSION: Ultrasound guided biopsy of 1.0 cm RIGHT breast mass (12 o'clock retroareolar). No apparent complications. Electronically Signed: By: Aliene Lloyd M.D. On: 10/13/2023 12:55   MM CLIP PLACEMENT RIGHT Result Date: 10/13/2023 CLINICAL DATA:  Status post ultrasound-guided core needle biopsy of RIGHT retroareolar mass. EXAM: 3D DIAGNOSTIC RIGHT MAMMOGRAM POST ULTRASOUND BIOPSY  COMPARISON:  Previous exam(s). ACR Breast Density Category b: There are scattered areas of fibroglandular density. FINDINGS: 3D Mammographic images were obtained following ultrasound guided biopsy of RIGHT retroareolar breast mass. The biopsy marking clip is in expected position at the site of biopsy. IMPRESSION: Appropriate positioning of the heart shaped biopsy marking clip at the site of biopsy in the retroareolar RIGHT breast. The position of the clip and biopsied mass corresponds to the asymmetry seen on prior mammogram. Final Assessment: Post Procedure Mammograms for Marker Placement Electronically Signed   By: Aliene Lloyd M.D.   On: 10/13/2023 12:57   MM 3D DIAGNOSTIC MAMMOGRAM UNILATERAL RIGHT BREAST Result Date: 09/27/2023 CLINICAL DATA:  Short-term follow-up for RIGHT breast DCIS. Patient had a 2 site stereotactic core needle biopsy of RIGHT breast calcifications on 01/18/2021. Biopsies demonstrated intermediate grade DCIS (X clip) and complex sclerosing lesion (coil clip). The X clip was displaced 7 cm medially. Patient also has a history of a RIGHT breast biopsy of architectural distortion in July 2024, revealing atypical lobular hyperplasia (RIBBON clip). Due to significant COPD, surgery has not been performed. Patient is undergoing antiestrogen therapy with imaging surveillance. EXAM: DIGITAL DIAGNOSTIC UNILATERAL RIGHT MAMMOGRAM WITH TOMOSYNTHESIS AND CAD; ULTRASOUND RIGHT BREAST LIMITED TECHNIQUE: Right digital diagnostic mammography  and breast tomosynthesis was performed. The images were evaluated with computer-aided detection. ; Targeted ultrasound examination of the right breast was performed COMPARISON:  Previous exam(s). ACR Breast Density Category b: There are scattered areas of fibroglandular density. FINDINGS: MAMMOGRAM: Spot magnification views demonstrate faint amorphous calcifications in a segmental distribution in the outer right breast, which appear mammographically stable compared to January 2025 and decreased in conspicuity compared to July 2024 and July 2023. This is consistent with biopsy-proven ductal carcinoma in-situ. Stable positioning of the X shaped biopsy marking clip, which was medially migrated from the site of biopsy proven DCIS. There is a subtle asymmetry in the retroareolar right breast that persists on spot tomosynthesis views (spot CC 2 of 2 image 39/51). This is not definitely stable compared to prior examinations. Questioned asymmetry is in the outer right breast middle to posterior depth do not persist on spot tomosynthesis views, with fibroglandular configuration that is mammographically stable compared to remote priors. Stable positioning of the COIL clip in the inner central right breast, demarcating site of previously biopsied complex sclerosing lesion. Additionally, stable positioning of the RIBBON shaped biopsy marking clip in the upper-outer right breast posterior depth, demarcating site of biopsy-proven atypical lobular hyperplasia. No increased density, distortion, or suspicious calcifications are noted in these areas. No additional new suspicious mass, calcification, or other findings are identified in the right breast. ULTRASOUND: Targeted right breast ultrasound was performed by the sonographer and physician: In the right breast 12 o'clock retroareolar position, there is a subtle irregular hypoechoic mass with indistinct margins. It measures 10 x 5 x 4 mm. There is no internal vascularity. This may  correspond to the asymmetry seen on mammogram. Confirmatory targeted right breast ultrasound was performed in the upper-outer quadrant. No suspicious solid or cystic mass or area of shadowing is identified. IMPRESSION: 1. RIGHT breast subtle irregular hypoechoic mass in the 12 o'clock retroareolar position is indeterminate. Recommend further assessment with ultrasound-guided biopsy. 2. Stable faint amorphous calcifications in the outer RIGHT breast, consistent with known intermediate grade DCIS. No evidence disease progression in this area. 3. Stable mammographic appearance of biopsy-proven complex sclerosing lesion (COIL clip) and atypical lobular hyperplasia (RIBBON clip). RECOMMENDATION: 1. RIGHT breast ultrasound-guided biopsy (1 site).  2. Continued clinical management for biopsy-proven DCIS and additional high risk lesions in the RIGHT breast. I have discussed the findings and recommendations with the patient. If applicable, a reminder letter will be sent to the patient regarding the next appointment. BI-RADS CATEGORY  4: Suspicious. Electronically Signed   By: Dirk Arrant M.D.   On: 09/27/2023 14:39   US  LIMITED ULTRASOUND INCLUDING AXILLA RIGHT BREAST Result Date: 09/27/2023 CLINICAL DATA:  Short-term follow-up for RIGHT breast DCIS. Patient had a 2 site stereotactic core needle biopsy of RIGHT breast calcifications on 01/18/2021. Biopsies demonstrated intermediate grade DCIS (X clip) and complex sclerosing lesion (coil clip). The X clip was displaced 7 cm medially. Patient also has a history of a RIGHT breast biopsy of architectural distortion in July 2024, revealing atypical lobular hyperplasia (RIBBON clip). Due to significant COPD, surgery has not been performed. Patient is undergoing antiestrogen therapy with imaging surveillance. EXAM: DIGITAL DIAGNOSTIC UNILATERAL RIGHT MAMMOGRAM WITH TOMOSYNTHESIS AND CAD; ULTRASOUND RIGHT BREAST LIMITED TECHNIQUE: Right digital diagnostic mammography and  breast tomosynthesis was performed. The images were evaluated with computer-aided detection. ; Targeted ultrasound examination of the right breast was performed COMPARISON:  Previous exam(s). ACR Breast Density Category b: There are scattered areas of fibroglandular density. FINDINGS: MAMMOGRAM: Spot magnification views demonstrate faint amorphous calcifications in a segmental distribution in the outer right breast, which appear mammographically stable compared to January 2025 and decreased in conspicuity compared to July 2024 and July 2023. This is consistent with biopsy-proven ductal carcinoma in-situ. Stable positioning of the X shaped biopsy marking clip, which was medially migrated from the site of biopsy proven DCIS. There is a subtle asymmetry in the retroareolar right breast that persists on spot tomosynthesis views (spot CC 2 of 2 image 39/51). This is not definitely stable compared to prior examinations. Questioned asymmetry is in the outer right breast middle to posterior depth do not persist on spot tomosynthesis views, with fibroglandular configuration that is mammographically stable compared to remote priors. Stable positioning of the COIL clip in the inner central right breast, demarcating site of previously biopsied complex sclerosing lesion. Additionally, stable positioning of the RIBBON shaped biopsy marking clip in the upper-outer right breast posterior depth, demarcating site of biopsy-proven atypical lobular hyperplasia. No increased density, distortion, or suspicious calcifications are noted in these areas. No additional new suspicious mass, calcification, or other findings are identified in the right breast. ULTRASOUND: Targeted right breast ultrasound was performed by the sonographer and physician: In the right breast 12 o'clock retroareolar position, there is a subtle irregular hypoechoic mass with indistinct margins. It measures 10 x 5 x 4 mm. There is no internal vascularity. This may  correspond to the asymmetry seen on mammogram. Confirmatory targeted right breast ultrasound was performed in the upper-outer quadrant. No suspicious solid or cystic mass or area of shadowing is identified. IMPRESSION: 1. RIGHT breast subtle irregular hypoechoic mass in the 12 o'clock retroareolar position is indeterminate. Recommend further assessment with ultrasound-guided biopsy. 2. Stable faint amorphous calcifications in the outer RIGHT breast, consistent with known intermediate grade DCIS. No evidence disease progression in this area. 3. Stable mammographic appearance of biopsy-proven complex sclerosing lesion (COIL clip) and atypical lobular hyperplasia (RIBBON clip). RECOMMENDATION: 1. RIGHT breast ultrasound-guided biopsy (1 site). 2. Continued clinical management for biopsy-proven DCIS and additional high risk lesions in the RIGHT breast. I have discussed the findings and recommendations with the patient. If applicable, a reminder letter will be sent to the patient regarding the next appointment. BI-RADS  CATEGORY  4: Suspicious. Electronically Signed   By: Dirk Arrant M.D.   On: 09/27/2023 14:39    All questions were answered. The patient knows to call the clinic with any problems, questions or concerns.      Amber Stalls, MD 10/20/2023 12:30 PM

## 2023-11-20 DIAGNOSIS — M79671 Pain in right foot: Secondary | ICD-10-CM | POA: Diagnosis not present

## 2023-11-20 DIAGNOSIS — M79672 Pain in left foot: Secondary | ICD-10-CM | POA: Diagnosis not present

## 2023-11-20 DIAGNOSIS — M79675 Pain in left toe(s): Secondary | ICD-10-CM | POA: Diagnosis not present

## 2023-11-20 DIAGNOSIS — M79674 Pain in right toe(s): Secondary | ICD-10-CM | POA: Diagnosis not present

## 2023-11-20 DIAGNOSIS — L110 Acquired keratosis follicularis: Secondary | ICD-10-CM | POA: Diagnosis not present

## 2023-11-20 DIAGNOSIS — E1151 Type 2 diabetes mellitus with diabetic peripheral angiopathy without gangrene: Secondary | ICD-10-CM | POA: Diagnosis not present

## 2023-11-20 DIAGNOSIS — E1140 Type 2 diabetes mellitus with diabetic neuropathy, unspecified: Secondary | ICD-10-CM | POA: Diagnosis not present

## 2023-12-28 ENCOUNTER — Other Ambulatory Visit: Payer: Self-pay | Admitting: Hematology and Oncology

## 2023-12-28 DIAGNOSIS — Z1231 Encounter for screening mammogram for malignant neoplasm of breast: Secondary | ICD-10-CM

## 2023-12-28 DIAGNOSIS — Z853 Personal history of malignant neoplasm of breast: Secondary | ICD-10-CM

## 2024-02-05 DIAGNOSIS — E1151 Type 2 diabetes mellitus with diabetic peripheral angiopathy without gangrene: Secondary | ICD-10-CM | POA: Diagnosis not present

## 2024-02-05 DIAGNOSIS — E1140 Type 2 diabetes mellitus with diabetic neuropathy, unspecified: Secondary | ICD-10-CM | POA: Diagnosis not present

## 2024-02-20 ENCOUNTER — Telehealth: Payer: Self-pay | Admitting: Hematology and Oncology

## 2024-02-20 NOTE — Telephone Encounter (Signed)
 I spoke to patient regarding the rescheduling of 04/22/24 appt to 04/23/24. Patient is aware of new appt date and time.

## 2024-04-01 ENCOUNTER — Encounter

## 2024-04-18 ENCOUNTER — Encounter

## 2024-04-22 ENCOUNTER — Ambulatory Visit: Admitting: Hematology and Oncology

## 2024-04-23 ENCOUNTER — Inpatient Hospital Stay: Admitting: Hematology and Oncology
